# Patient Record
Sex: Female | Born: 1940 | Hispanic: No | Smoking: Former smoker
Health system: Southern US, Community
[De-identification: ages and names within clinical notes are randomized; demographics above are authoritative.]

## PROBLEM LIST (undated history)

## (undated) DIAGNOSIS — E079 Disorder of thyroid, unspecified: Secondary | ICD-10-CM

## (undated) DIAGNOSIS — I10 Essential (primary) hypertension: Secondary | ICD-10-CM

---

## 2000-03-22 ENCOUNTER — Other Ambulatory Visit: Admission: RE | Admit: 2000-03-22 | Discharge: 2000-03-22 | Payer: Self-pay | Admitting: Obstetrics & Gynecology

## 2009-05-09 ENCOUNTER — Encounter (HOSPITAL_COMMUNITY): Admission: RE | Admit: 2009-05-09 | Discharge: 2009-06-08 | Payer: Self-pay | Admitting: Pulmonary Disease

## 2009-06-10 ENCOUNTER — Encounter (HOSPITAL_COMMUNITY): Admission: RE | Admit: 2009-06-10 | Discharge: 2009-07-10 | Payer: Self-pay | Admitting: Pulmonary Disease

## 2010-11-06 ENCOUNTER — Emergency Department (HOSPITAL_COMMUNITY)
Admission: EM | Admit: 2010-11-06 | Discharge: 2010-11-06 | Disposition: A | Discharge status: Home / Self Care | Payer: Medicare Other | Attending: Emergency Medicine | Admitting: Emergency Medicine

## 2010-11-06 DIAGNOSIS — Z853 Personal history of malignant neoplasm of breast: Secondary | ICD-10-CM | POA: Insufficient documentation

## 2010-11-06 DIAGNOSIS — S20219A Contusion of unspecified front wall of thorax, initial encounter: Secondary | ICD-10-CM | POA: Insufficient documentation

## 2010-11-06 DIAGNOSIS — Y92410 Unspecified street and highway as the place of occurrence of the external cause: Secondary | ICD-10-CM | POA: Insufficient documentation

## 2010-11-06 DIAGNOSIS — S8010XA Contusion of unspecified lower leg, initial encounter: Secondary | ICD-10-CM | POA: Insufficient documentation

## 2010-11-06 DIAGNOSIS — I10 Essential (primary) hypertension: Secondary | ICD-10-CM | POA: Insufficient documentation

## 2010-11-06 DIAGNOSIS — S139XXA Sprain of joints and ligaments of unspecified parts of neck, initial encounter: Secondary | ICD-10-CM | POA: Insufficient documentation

## 2011-03-12 ENCOUNTER — Ambulatory Visit (HOSPITAL_COMMUNITY)
Admission: RE | Admit: 2011-03-12 | Discharge: 2011-03-12 | Disposition: A | Discharge status: Home / Self Care | Payer: Medicare Other | Source: Ambulatory Visit | Attending: Pulmonary Disease | Admitting: Pulmonary Disease

## 2011-03-12 DIAGNOSIS — IMO0001 Reserved for inherently not codable concepts without codable children: Secondary | ICD-10-CM | POA: Insufficient documentation

## 2011-03-12 DIAGNOSIS — I972 Postmastectomy lymphedema syndrome: Secondary | ICD-10-CM | POA: Insufficient documentation

## 2011-03-12 DIAGNOSIS — I890 Lymphedema, not elsewhere classified: Secondary | ICD-10-CM | POA: Insufficient documentation

## 2011-03-12 NOTE — Patient Instructions (Addendum)
Self massage

## 2011-03-12 NOTE — Progress Notes (Signed)
Physical Therapy Evaluation  Patient Details  Name: SARIYAH CORCINO MRN: [id number] Date of Birth: [birth year]  Today's Date: 03/12/2011 Time: 4098-1191 Time Calculation (min): 47 min Visit#: 1  of 12   Re-eval: 04/11/11    Past Medical History: No past medical history on file. Past Surgical History: No past surgical history on file.  Subjective Symptoms/Limitations Symptoms: The patient states that she had two surgeries the first was a lumpectomy with 5 nodes removed.  The second surgery,two weeks later,was a radical masectomy  with 6 nodes removed.  The patitent states that she opted not to have chemo or radiation therapy.  She states that she noticed a few months after surgery she noted that she had more swelling in her right arm.. She has recieved an arm sleeve and  glove.  The patient states that she wraps herselft nightly  and self massages nightly.  The patient states that  she has several sleeves and gloves and completes daily bandaging.  The patient states that she was so swollen that her arm was hurting and warm about a month ago.  She states that she has been trying to manage the lymphedema at home and it is significantly better than a month ago but she is unable to get the swelling down to it's previous level.  She has made good gains on her own but she is still experiencing increased swelling therefore she has beeen referred to physical therepy.  How long can you sit comfortably?: no problem How long can you stand comfortably?: no problem How long can you walk comfortably?: no problem Pain Assessment Currently in Pain?: No/denies  Precautions/Restrictions  S/P radical masectomy R  Prior Functioning    I in wrapping and self-massage.    Assessment:    Pt total volume of her R UE is 2183.002 Left is 1550.51.        Physical Therapy Assessment and Plan PT Assessment and Plan Clinical Impression Statement: Pt with lymphedema which is affecting quality of life.  Pt will  benefit from lymph massage therapy. Rehab Potential: Good Clinical Impairments Affecting Rehab Potential: Increased pain limiting activity PT Frequency: Min 3X/week PT Duration: 4 weeks PT Plan: see three times a week for lymph massage, review of bandaging techiniques     Goals PT Short Term Goals Time to Complete Short Term Goals: 2 weeks PT Short Term Goal 1: I in skin care and bandage care PT Short Term Goal 2: I in HEP to promote lympatic circulation PT Short Term Goal 3: reduce volume by 50% PT Long Term Goals PT Long Term Goal 1: 4 wk reduce volume by 80% PT Long Term Goal 2: verbalize risk of reinjury - 4 wk  Problem List Patient Active Problem List  Diagnoses  . Lymphedema of arm    PT - End of Session Activity Tolerance: Patient tolerated treatment well General Behavior During Session: [facility] for tasks performed   RUSSELL,CINDY 03/12/2011, 5:28 PM  Physician Documentation Your signature is required to indicate approval of the treatment plan as stated above.  Please sign and either send electronically or make a copy of this report for your files and return this physician signed original.   Please mark one 1.__approve of plan  2. ___approve of plan with the following conditions.   ______________________________  _____________________ Physician Signature                                                                                                             Date

## 2011-03-16 ENCOUNTER — Ambulatory Visit (HOSPITAL_COMMUNITY)
Admission: RE | Admit: 2011-03-16 | Discharge: 2011-03-16 | Disposition: A | Discharge status: Home / Self Care | Payer: Medicare Other | Source: Ambulatory Visit | Attending: Pulmonary Disease | Admitting: Pulmonary Disease

## 2011-03-16 DIAGNOSIS — I890 Lymphedema, not elsewhere classified: Secondary | ICD-10-CM

## 2011-03-16 NOTE — Progress Notes (Signed)
Physical Therapy Treatment Patient Details  Name: Anita Weiss MRN: [id number] Date of Birth: [birth year]  Today's Date: 03/16/2011 Time: 1120-1204 Time Calculation (min): 44 min Visit#: 2  of 12   Re-eval: 04/10/11 Charges:  Manual 40'    Subjective: Symptoms/Limitations Symptoms: Pt. reports she's been doing her bandaging as needed and wearing her compression garments.  No pain today. Pain Assessment Currently in Pain?: No/denies   Exercise/Treatments Manual Therapy:  Manual Lymph Drainage to Right Upper Extremity.  Physical Therapy Assessment and Plan PT Assessment and Plan Clinical Impression Statement: Good results from lymph massage; to inquire insurance coverage for a compression pump for patient. PT Plan: Continue per POC.     Problem List Patient Active Problem List  Diagnoses  . Lymphedema of arm    PT - End of Session Activity Tolerance: Patient tolerated treatment well General Behavior During Session: [facility] for tasks performed Cognition: [facility] for tasks performed  Lurena Nida 03/16/2011, 12:52 PM

## 2011-03-19 ENCOUNTER — Ambulatory Visit (HOSPITAL_COMMUNITY)
Admission: RE | Admit: 2011-03-19 | Discharge: 2011-03-19 | Disposition: A | Discharge status: Home / Self Care | Payer: Medicare Other | Source: Ambulatory Visit | Attending: Pulmonary Disease | Admitting: Pulmonary Disease

## 2011-03-19 NOTE — Progress Notes (Signed)
Physical Therapy Treatment Patient Details  Name: Anita Weiss MRN: [id number] Date of Birth: Mar 29, 1941  Today's Date: 03/19/2011 Time: 2130-8657 Time Calculation (min): 41 min Visit#: 3  of 12   Re-eval: 04/10/11 Charges:  Manual 38'    Subjective: Symptoms/Limitations Symptoms: Pt. states she could tell a difference after last session.  States her compression garments are getting old and worn out and will get new ones.  Pt. reports the compression pump representative contacted her to get things in order. Pain Assessment Currently in Pain?: No/denies  Exercise/Treatments  Manual Therapy Manual Therapy:  (Manual Lymph Drainage)  Physical Therapy Assessment and Plan PT Assessment and Plan Clinical Impression Statement: Good results from lymph massage. PT Plan: Continue per POC     Problem List Patient Active Problem List  Diagnoses  . Lymphedema of arm    PT - End of Session Activity Tolerance: Patient tolerated treatment well General Behavior During Session: [facility] for tasks performed Cognition: [facility] for tasks performed  Anita Weiss B 03/19/2011, 5:56 PM

## 2011-03-23 ENCOUNTER — Ambulatory Visit (HOSPITAL_COMMUNITY)
Admission: RE | Admit: 2011-03-23 | Discharge: 2011-03-23 | Disposition: A | Discharge status: Home / Self Care | Payer: Medicare Other | Source: Ambulatory Visit | Attending: Pulmonary Disease | Admitting: Pulmonary Disease

## 2011-03-23 DIAGNOSIS — IMO0001 Reserved for inherently not codable concepts without codable children: Secondary | ICD-10-CM | POA: Insufficient documentation

## 2011-03-23 DIAGNOSIS — I972 Postmastectomy lymphedema syndrome: Secondary | ICD-10-CM | POA: Insufficient documentation

## 2011-03-23 NOTE — Progress Notes (Signed)
Physical Therapy Treatment Patient Details  Name: Anita Weiss MRN: [id number] Date of Birth: [birth year]  Today's Date: 03/23/2011 Time: 3474-2595 Time Calculation (min): 34 min Visit#: 4  of 12   Re-eval: 04/10/11 Charges:  Manual 30 minutes    Subjective: Symptoms/Limitations Symptoms: Pt. reports continued improvement with decreased swelling and heaviness. Pain Assessment Currently in Pain?: No/denies  Exercise/Treatments  Manual Therapy Manual Therapy:  (Manual Lymph Drainage)  Physical Therapy Assessment and Plan PT Assessment and Plan Clinical Impression Statement: Continued compliance with home maintenance and self massage.  Noted reduction in lymph fluid. PT Treatment/Interventions:  (Manual Technique) PT Plan: Continue per POC.    Problem List Patient Active Problem List  Diagnoses  . Lymphedema of arm    PT - End of Session Activity Tolerance: Patient tolerated treatment well General Behavior During Session: [facility] for tasks performed Cognition: [facility] for tasks performed  Emeline Gins B 03/23/2011, 5:32 PM

## 2011-03-26 ENCOUNTER — Ambulatory Visit (HOSPITAL_COMMUNITY)
Admission: RE | Admit: 2011-03-26 | Discharge: 2011-03-26 | Disposition: A | Discharge status: Home / Self Care | Payer: Medicare Other | Source: Ambulatory Visit | Attending: Pulmonary Disease | Admitting: Pulmonary Disease

## 2011-03-26 DIAGNOSIS — I890 Lymphedema, not elsewhere classified: Secondary | ICD-10-CM

## 2011-03-26 NOTE — Progress Notes (Signed)
Physical Therapy Treatment Patient Details  Name: Anita Weiss MRN: [id number] Date of Birth: April 29, 1941  Today's Date: 03/26/2011 Time: 6213-0865 Time Calculation (min): 37 min Visit#: 5  of 12   Re-eval: 04/10/11 Charges:  Manual 35'    Subjective: Symptoms/Limitations Symptoms: Pt. states it is getting better.  Reports she will be out of town all next week but will be returning the next week. Pain Assessment Currently in Pain?: No/denies  Exercise/Treatments  Manual Therapy Manual Therapy: Other (comment) Other Manual Therapy: Manual Lymph Drainage  Physical Therapy Assessment and Plan PT Assessment and Plan Clinical Impression Statement: Continued positive results with manual lymph massage. PT Treatment/Interventions:  (Manual Lymph Drainage) PT Plan: Re-assess upon return (2 more visits).    Problem List Patient Active Problem List  Diagnoses  . Lymphedema of arm    PT - End of Session Activity Tolerance: Patient tolerated treatment well General Behavior During Session: [facility] for tasks performed Cognition: [facility] for tasks performed  Emeline Gins B 03/26/2011, 2:27 PM

## 2011-03-30 ENCOUNTER — Ambulatory Visit (HOSPITAL_COMMUNITY): Payer: Medicare Other | Admitting: Physical Therapy

## 2011-04-06 ENCOUNTER — Ambulatory Visit (HOSPITAL_COMMUNITY)
Admission: RE | Admit: 2011-04-06 | Discharge: 2011-04-06 | Disposition: A | Discharge status: Home / Self Care | Payer: Medicare Other | Source: Ambulatory Visit | Attending: Pulmonary Disease | Admitting: Pulmonary Disease

## 2011-04-06 NOTE — Progress Notes (Signed)
Physical Therapy Treatment Patient Details  Name: ORRIE LASCANO MRN: [id number] Date of Birth: [birth year]  Today's Date: 04/06/2011 Time: 4098-1191 Time Calculation (min): 45 min Visit#: 6  of 12   Re-eval: 04/09/11 Charges:  Manual 40 minutes   Subjective: Symptoms/Limitations Symptoms: Pt. reports she had a good vacation.  States she found her night compression sleeve and has been wearing it and doing self-massages when she can. Pain Assessment Currently in Pain?: No/denies   Exercise/Treatments Manual Therapy: Manual Lymph Drainage for Right Upper Extremity  Physical Therapy Assessment and Plan PT Assessment and Plan Clinical Impression Statement: Continued improvement despite week of missed therapy.  Pt. contacted by compression pump rep. [facility]) and will follow up with expected issue date. PT Plan: Remeasure next visit.    Problem List Patient Active Problem List  Diagnoses  . Lymphedema of arm    PT - End of Session Activity Tolerance: Patient tolerated treatment well General Behavior During Session: [facility] for tasks performed Cognition: [facility] for tasks performed  Emeline Gins B 04/06/2011, 2:03 PM

## 2011-04-09 ENCOUNTER — Ambulatory Visit (HOSPITAL_COMMUNITY): Admission: RE | Admit: 2011-04-09 | Payer: Medicare Other | Source: Ambulatory Visit | Admitting: Physical Therapy

## 2011-04-15 ENCOUNTER — Ambulatory Visit (HOSPITAL_COMMUNITY)
Admission: RE | Admit: 2011-04-15 | Discharge: 2011-04-15 | Disposition: A | Discharge status: Home / Self Care | Payer: Medicare Other | Source: Ambulatory Visit | Attending: Pulmonary Disease | Admitting: Pulmonary Disease

## 2011-04-15 NOTE — Progress Notes (Signed)
Physical Therapy Treatment Patient Details  Name: Anita Weiss MRN: [id number] Date of Birth: Oct 17, 1940  Today's Date: 04/15/2011 Time: 1515-1600 Time Calculation (min): 45 min Visit#: 7  (Pt. missed 1 week due to vacation) of 12   Re-eval: 04/15/11 Charges:  Manual 40 minutes    Subjective: Symptoms/Limitations Symptoms: Pt. states she still has not heard from the compression pump; Informed I would contact the representative.  Pt. reports her arm is still not as small as she would like. Pain Assessment Currently in Pain?: No/denies  Objective:   Exercise/Treatments Manual Therapy Other Manual Therapy: manual lymph drainage for Right upper extremity.  Physical Therapy Assessment and Plan PT Assessment and Plan Clinical Impression Statement: Right upper extremity measured today with overall reduction of fluid from 2,183cc to 2,099cc equating to 86% fluid reduction.  Pt. has made good progress, however would like to continue 1X week until receives pump and purchases new compression garments. PT Frequency: Min 1X/week PT Duration: 4 weeks PT Plan: Suggest to continue 1X week for 4 weeks to progress fluid reduction.  To contact [facility] regarding pump.    Goals PT Short Term Goals Time to Complete Short Term Goals: 2 weeks PT Short Term Goal 1: I in skin care and bandage care PT Short Term Goal 1 - Progress: Met PT Short Term Goal 2: I in HEP to promote lympatic circulation PT Short Term Goal 2 - Progress: Met PT Short Term Goal 3: reduce volume by 50% PT Short Term Goal 3 - Progress: Met PT Long Term Goals PT Long Term Goal 1: 4 wk reduce volume by 80% PT Long Term Goal 1 - Progress: Met PT Long Term Goal 2: verbalize risk of reinjury - 4 wk PT Long Term Goal 2 - Progress: Met  Problem List Patient Active Problem List  Diagnoses  . Lymphedema of arm    PT - End of Session Activity Tolerance: Patient tolerated treatment well General Behavior During  Session: [facility] for tasks performed Cognition: [facility] for tasks performed  Lurena Nida 04/15/2011, 5:49 PM

## 2011-04-23 ENCOUNTER — Ambulatory Visit (HOSPITAL_COMMUNITY): Payer: Medicare Other | Admitting: Physical Therapy

## 2011-04-30 ENCOUNTER — Ambulatory Visit (HOSPITAL_COMMUNITY): Payer: Medicare Other | Admitting: Physical Therapy

## 2011-05-13 ENCOUNTER — Ambulatory Visit (HOSPITAL_COMMUNITY): Payer: Medicare Other | Admitting: Physical Therapy

## 2011-05-20 ENCOUNTER — Ambulatory Visit (HOSPITAL_COMMUNITY): Payer: Medicare Other | Admitting: Physical Therapy

## 2011-11-24 ENCOUNTER — Ambulatory Visit (HOSPITAL_COMMUNITY)
Admission: RE | Admit: 2011-11-24 | Discharge: 2011-11-24 | Disposition: A | Discharge status: Home / Self Care | Payer: Medicare Other | Source: Ambulatory Visit | Attending: Pulmonary Disease | Admitting: Pulmonary Disease

## 2011-11-24 DIAGNOSIS — I972 Postmastectomy lymphedema syndrome: Secondary | ICD-10-CM | POA: Insufficient documentation

## 2011-11-24 DIAGNOSIS — IMO0001 Reserved for inherently not codable concepts without codable children: Secondary | ICD-10-CM | POA: Insufficient documentation

## 2011-11-24 DIAGNOSIS — I890 Lymphedema, not elsewhere classified: Secondary | ICD-10-CM

## 2011-11-24 NOTE — Evaluation (Signed)
Physical Therapy Evaluation  Patient Details  Name: Anita Weiss MRN: [id number] Date of Birth: 09-29-1940  Today's Date: 11/24/2011 Time: 0865-7846 PT Time Calculation (min): 47 min  Visit#:   of 8   Re-eval: 12/24/11 Assessment Diagnosis: lymphedema R UE Surgical Date: 01/09/11 Next MD Visit: next month Prior Therapy: lymphedma massage  last Rx 19yrs   Authorization: medicare  Authorization Time Period:    Authorization Visit#:   of     Past Medical History: No past medical history on file. Past Surgical History: No past surgical history on file.  Subjective Symptoms/Limitations Symptoms: Anita Weiss has been seen in this clinic for lymhedma before.  She was doing well and she noticed a rash all over her and had so much swelling in her right arm that she was blooding her garments three months ago.  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  The pt states that she is actually doing significantly better but she is still not where she wants to be so she was referred for lymph massage.   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  Functional Tests Functional Tests: volume of L  5700.5 R 7593.38  Assessment RUE Assessment RUE Assessment: Within Functional Limits RUE AROM (degrees) Overall AROM Right Upper Extremity: Within functional limits for tasks performed  Exercise/Treatments Manual lymph drainage massage.    Physical Therapy Assessment and Plan PT Assessment and Plan Clinical Impression Statement: Pt with increased volume of Right UE that is not responding to home massage and bandaging or pumping.  PT will benefit from skilled PT to reduce volume of R UE to reduce risk of infection. Rehab Potential: Good PT Frequency: Min 3X/week PT Duration: 4 weeks PT Treatment/Interventions: Other (comment) PT  Plan: manual lymph drainage    Goals Home Exercise Program Pt will Perform Home Exercise Program: Independently PT Short Term Goals Time to Complete Short Term Goals: 2 weeks PT Short Term Goal 1: I in skin care and bandage care PT Short Term Goal 2: I HEP to promote lymphatic circulation PT Short Term Goal 3: reduce volume by 50% PT Long Term Goals Time to Complete Long Term Goals: 4 weeks PT Long Term Goal 1: reduce volume by 80% PT Long Term Goal 2: verbalize risk of reinjury  Problem List Patient Active Problem List  Diagnoses  . Lymphedema of arm    PT - End of Session Activity Tolerance: Patient tolerated treatment well General Behavior During Session: Anita Weiss for tasks performed Cognition: Anita Weiss for tasks performed PT Plan of Care PT Home Exercise Plan: If no notable difference in volume after two treatments begin compression bandaging. Consulted and Agree with Plan of Care: Patient  GP  Functional Reporting Modifier  Current Status  [id number] - Other PT/OT Primary CJ - At least 20% but less than 40% impaired, limited or restricted  Goal Status  [phone] - Other PT/OT Primary CI - At least 1% but less than 20% impaired, limited or restricted  I chose CJ due to volume being 33% above normal; CI due to the fact pt will not ever get down 100% Anita Weiss,Anita Weiss 11/24/2011, 4:45 PM  Physician Documentation Your signature is required to indicate approval of the treatment plan as stated above.  Please sign and either send electronically or make a copy of this report for your files and return this  physician signed original.   Please mark one 1.__approve of plan  2. ___approve of plan with the following conditions.   ______________________________                                                          _____________________ Physician Signature                                                                                                             Date

## 2011-11-26 ENCOUNTER — Ambulatory Visit (HOSPITAL_COMMUNITY)
Admission: RE | Admit: 2011-11-26 | Discharge: 2011-11-26 | Disposition: A | Discharge status: Home / Self Care | Payer: Medicare Other | Source: Ambulatory Visit | Attending: *Deleted | Admitting: *Deleted

## 2011-11-26 NOTE — Progress Notes (Signed)
Physical Therapy Lymphedema Treatment Patient Details  Name: Anita Weiss MRN: [id number] Date of Birth: 04-19-1941  Today's Date: 11/26/2011 Time: 1520-1600 PT Time Calculation (min): 40 min Visit#: 2  of 12   Re-eval: 12/24/11 Authorization: medicare  Authorization Visit#: 2  of 10   Charges:  Manual 40'  Subjective: Symptoms/Limitations Symptoms: Pt. states she has 3 garments she rotates but needs to order a newer one.  Pt. given information of website to order from.  Pt. reports no pain and the itching has significantly improved. Pain Assessment Currently in Pain?: No/denies  Objective: Manual Therapy Other Manual Therapy: Manual Lymph Drainage of R UE into R inguinal nodes and L axillary nodes  Physical Therapy Assessment and Plan PT Assessment and Plan Clinical Impression Statement: MLD completed to R UE without further complication to irritated skin.  Pt. reported "feeling the fluid move" as MLD performed. PT Plan: Re-measure next visit.    Problem List Patient Active Problem List  Diagnoses  . Lymphedema of arm    PT - End of Session Activity Tolerance: Patient tolerated treatment well General Behavior During Session: [facility] for tasks performed Cognition: [facility] for tasks performed   Lurena Nida, PTA/CLT 11/26/2011, 4:56 PM

## 2011-12-09 ENCOUNTER — Ambulatory Visit (HOSPITAL_COMMUNITY): Payer: Medicare Other | Admitting: Physical Therapy

## 2011-12-10 ENCOUNTER — Ambulatory Visit (HOSPITAL_COMMUNITY): Payer: Medicare Other | Admitting: Physical Therapy

## 2011-12-14 ENCOUNTER — Ambulatory Visit (HOSPITAL_COMMUNITY)
Admission: RE | Admit: 2011-12-14 | Discharge: 2011-12-14 | Disposition: A | Discharge status: Home / Self Care | Payer: Medicare Other | Source: Ambulatory Visit | Attending: *Deleted | Admitting: *Deleted

## 2011-12-14 NOTE — Progress Notes (Signed)
Physical Therapy Lymphedema Treatment Patient Details  Name: Anita Weiss MRN: [id number] Date of Birth: 06-11-1941  Today's Date: 12/14/2011 Time: 1525-1605 PT Time Calculation (min): 40 min Visit#: 3  of 12   Re-eval: 12/24/11 Charges:  Manual 38'    Authorization: medicare  Authorization Time Period:    Authorization Visit#: 3  of 10    Subjective: Symptoms/Limitations Symptoms: Pt. states she has been helping her daughter move and has been using her UE's alot.  Pt. admits to not wearing her gauntlet as she should.  Pt. with noted swelling in hand today. Pain Assessment Currently in Pain?: No/denies  Objective: Manual Therapy Other Manual Therapy: Manual Lymph Drainage of R UE into R inguinal nodes and L axillary nodes    Measurements taken today of R UE as compared to L UE: Date 12/14/2011    right left  MCP 19.70 18.00  wrist 16.40 15.50  4cm 18.20 16.30  8cm 22.20 18.60  12cm 25.50 21.50  16cm 28.80 23.30  20cm 38.80 24.70  24cm 27.00 24.50  28cm 28.50 34.60  32cm 28.50 25.50  36cm 28.60 27.50  40cm 33.00 29.00  44cm    48cm    52cm    56cm    60cm    64cm        Sum of squares 8726.72 6836.04  Total Volume 2777.8022 2175.98    Physical Therapy Assessment and Plan PT Assessment and Plan Clinical Impression Statement: Total volume of lymph in R UE exceeds L by 601.82cc.  Unsure if volume has increased or decreased due to measurements recorded at evaluation were the sum of the measurements, not volume.  Pt. has missed the last 2 weeks due to vacation.   Pt. did have noted improvement after last  round with therapy and will be able to keep all future appts.  PT Plan: Continue current POC.  Measure again next week for progress.     Problem List Patient Active Problem List  Diagnosis  . Lymphedema of arm      Lurena Nida, PTA/CLT 12/14/2011, 4:39 PM

## 2011-12-17 ENCOUNTER — Ambulatory Visit (HOSPITAL_COMMUNITY)
Admission: RE | Admit: 2011-12-17 | Discharge: 2011-12-17 | Disposition: A | Discharge status: Home / Self Care | Payer: Medicare Other | Source: Ambulatory Visit | Attending: *Deleted | Admitting: *Deleted

## 2011-12-17 NOTE — Progress Notes (Signed)
Physical Therapy Treatment Patient Details  Name: Anita Weiss MRN: [id number] Date of Birth: [birth year]  Today's Date: 12/17/2011 Time: 1520-1600 PT Time Calculation (min): 40 min Visit#: 4  of 12   Re-eval: 12/24/11 Charges:  Manual 38'    Authorization: medicare  Authorization Visit#: 4  of 10    Subjective: Symptoms/Limitations Symptoms: Pt. states she urinated alot after last visit and has been trying to massage her sides/back better to route her fluid when using her pump. Pain Assessment Currently in Pain?: No/denies   Objective: Manual Therapy Other Manual Therapy: Manual Lymph Drainage of R UE into R inguinal nodes and L axillary nodes  Physical Therapy Assessment and Plan PT Assessment and Plan Clinical Impression Statement: Decreased fullness noted in R UE and lateral thorax today.  Pt. voiding more after last visit thus reducing volume.  PT Plan: Re-evaluate next visit; Pt. will be at 4 weeks.    Problem List Patient Active Problem List  Diagnosis  . Lymphedema of arm    Lurena Nida, PTA/CLT 12/17/2011, 6:10 PM

## 2011-12-21 ENCOUNTER — Ambulatory Visit (HOSPITAL_COMMUNITY)
Admission: RE | Admit: 2011-12-21 | Discharge: 2011-12-21 | Disposition: A | Discharge status: Home / Self Care | Payer: Medicare Other | Source: Ambulatory Visit | Attending: Pulmonary Disease | Admitting: Pulmonary Disease

## 2011-12-21 DIAGNOSIS — I890 Lymphedema, not elsewhere classified: Secondary | ICD-10-CM

## 2011-12-21 DIAGNOSIS — I972 Postmastectomy lymphedema syndrome: Secondary | ICD-10-CM | POA: Insufficient documentation

## 2011-12-21 DIAGNOSIS — IMO0001 Reserved for inherently not codable concepts without codable children: Secondary | ICD-10-CM | POA: Insufficient documentation

## 2011-12-21 NOTE — Progress Notes (Signed)
Physical Therapy Lymphedema Treatment Patient Details  Name: Anita Weiss MRN: [id number] Date of Birth: [birth year]  Today's Date: 12/21/2011 Time: 1522-1600 PT Time Calculation (min): 38 min Visit#: 5  of 24   Re-eval: 01/18/12 Charges:  Manual 38'   Authorization: medicare  Authorization Visit#: 5  of 10    Subjective: Symptoms/Limitations Symptoms: Pt. states she has continued to void well and has lost 2 pounds according to the scales this morning.  Pt. reports she doesn't feel as bloated in her stomach either. Pain Assessment Currently in Pain?: No/denies   Objective: Manual Therapy Other Manual Therapy: Manual Lymph Drainage of R UE into R inguinal nodes and L axillary nodes.  Physical Therapy Assessment and Plan PT Assessment and Plan Clinical Impression Statement: Pt with overall reduction of 300.86cc as compared to measurement taken on 6/24 (nearly 2 weeks ago).   Pt. Has met all her STG's and 1/3 LTG's and is overall responding well to MLD.   Pt. has had total of 5 visits of 12  due to vacation.  Pt would benefit from 4 more weeks to further reduce lymph fluid.   PT Frequency: Min 3X/week PT Duration: 4 weeks PT Plan: Request to continue X 4 more weeks.    Goals Home Exercise Program Pt will Perform Home Exercise Program: Independently PT Goal: Perform Home Exercise Program - Progress: Met PT Short Term Goals Time to Complete Short Term Goals: 2 weeks PT Short Term Goal 1: I in skin care and bandage care PT Short Term Goal 1 - Progress: Met PT Short Term Goal 2: I HEP to promote lymphatic circulation PT Short Term Goal 2 - Progress: Met PT Short Term Goal 3: reduce volume by 50% PT Short Term Goal 3 - Progress: Met PT Long Term Goals Time to Complete Long Term Goals: 4 weeks PT Long Term Goal 1: reduce volume by 80% PT Long Term Goal 1 - Progress: Not met PT Long Term Goal 2: verbalize risk of reinjury PT Long Term Goal 2 - Progress: Partly met  Problem  List Patient Active Problem List  Diagnosis  . Lymphedema of arm    PT - End of Session Activity Tolerance: Patient tolerated treatment well General Behavior During Session: [facility] for tasks performed Cognition: [facility] for tasks performed   Lurena Nida, PTA/CLT 12/21/2011, 6:11 PM

## 2011-12-28 ENCOUNTER — Ambulatory Visit (HOSPITAL_COMMUNITY)
Admission: RE | Admit: 2011-12-28 | Discharge: 2011-12-28 | Disposition: A | Discharge status: Home / Self Care | Payer: Medicare Other | Source: Ambulatory Visit | Attending: Pulmonary Disease | Admitting: Pulmonary Disease

## 2011-12-28 NOTE — Progress Notes (Signed)
Physical Therapy Lymphedema Treatment Patient Details  Name: Anita Weiss MRN: [id number] Date of Birth: 1941/06/08  Today's Date: 12/28/2011 Time: 4098-1191 PT Time Calculation (min): 38 min Visit#: 6  of 16   Re-eval: 01/18/12 Charges:  Manual 38'    Authorization: medicare  Authorization Visit#: 6  of 10    Subjective: Symptoms/Limitations Symptoms: Pt. states she got her appt. times mixed up today.  States her arm is feeling better with less tightness. Pain Assessment Currently in Pain?: No/denies   Exercise/Treatments Objective: Manual Therapy Other Manual Therapy: Manual Lymph Drainage of R UE into R inguinal nodes and L axillary nodes  Physical Therapy Assessment and Plan PT Assessment and Plan Clinical Impression Statement: Pt unable to complete 3 visits a week, but will come 2X week for the next 4 weeks.  Pt is continuing to respond well to MLD.    Problem List Patient Active Problem List  Diagnosis  . Lymphedema of arm    PT - End of Session Activity Tolerance: Patient tolerated treatment well General Behavior During Session: [facility] for tasks performed Cognition: [facility] for tasks performed PT Plan of Care PT Home Exercise Plan: Continue per POC X 4 more weeks.   Lurena Nida, PTA/CLT 12/28/2011, 4:46 PM

## 2011-12-30 ENCOUNTER — Ambulatory Visit (HOSPITAL_COMMUNITY): Payer: Medicare Other | Admitting: Physical Therapy

## 2012-01-05 ENCOUNTER — Ambulatory Visit (HOSPITAL_COMMUNITY)
Admission: RE | Admit: 2012-01-05 | Discharge: 2012-01-05 | Disposition: A | Discharge status: Home / Self Care | Payer: Medicare Other | Source: Ambulatory Visit | Attending: Pulmonary Disease | Admitting: Pulmonary Disease

## 2012-01-05 NOTE — Progress Notes (Signed)
Physical Therapy Treatment Patient Details  Name: Anita Weiss MRN: [id number] Date of Birth: [birth year]  Today's Date: 01/05/2012 Time: 1520-1555 PT Time Calculation (min): 35 min Visit#: 7  of 16   Re-eval: 01/18/12 Authorization: medicare  Authorization Visit#: 7  of 10  Charges:  Manual 30'   Subjective: Symptoms/Limitations Symptoms: Pt. with several red/irritated areas on her R forearm.  Area was warm to touch; pt. states she has been scratching it and has been itching.  Discussed possible cellulitis with patient and may want to see MD to treat.   Pain Assessment Currently in Pain?: No/denies  Objective: Manual Therapy Other Manual Therapy: Manual Lymph Drainage of R UE into R inguinal nodes and L axillary nodes   Physical Therapy Assessment and Plan PT Assessment and Plan Clinical Impression Statement: Concentrated on R lateral thoracic area, back and upper R arm.  Did not perform MLD on lower forearm due to red/warm area.  PT. advised to seek medical attention and symptoms to watch for cellulitis. PT Plan: Continue MLD.    Problem List Patient Active Problem List  Diagnosis  . Lymphedema of arm    PT - End of Session Activity Tolerance: Patient tolerated treatment well General Behavior During Session: [facility] for tasks performed Cognition: [facility] for tasks performed   Lurena Nida, PTA/CLT 01/05/2012, 4:37 PM

## 2012-01-11 ENCOUNTER — Ambulatory Visit (HOSPITAL_COMMUNITY)
Admission: RE | Admit: 2012-01-11 | Discharge: 2012-01-11 | Disposition: A | Discharge status: Home / Self Care | Payer: Medicare Other | Source: Ambulatory Visit | Attending: Pulmonary Disease | Admitting: Pulmonary Disease

## 2012-01-11 NOTE — Progress Notes (Signed)
Physical Therapy Lymphedema Treatment Patient Details  Name: Anita Weiss MRN: [id number] Date of Birth: 01/31/1941  Today's Date: 01/11/2012 Time: 4098-1191 PT Time Calculation (min): 43 min Visit#: 8  of 16   Re-eval: 01/18/12 Charges:  Manual 40'   Authorization: medicare  Authorization Time Period:    Authorization Visit#: 8  of 10    Subjective: Symptoms/Limitations Symptoms: Pt. statest the areas are still there but not itching or bothering her at all.  Pt. reports no changes/worsening after last session.  Pt. states she did not go to the MD, advised it was still a good idea. Pain Assessment Currently in Pain?: No/denies   Objective: Manual Therapy Other Manual Therapy: Manual Lymph Drainage of R UE into R inguinal nodes and L axillary nodes   Physical Therapy Assessment and Plan PT Assessment and Plan Clinical Impression Statement: Redness/warmth remains in distal forearm, however to worse/better.  Pt. still advised to seek medical attention.  No pain voiced with MLD today.  PT Plan: Remeasure next visit; re-eval X 2 more visits and issue G-Codes.     Problem List Patient Active Problem List  Diagnosis  . Lymphedema of arm    PT - End of Session Activity Tolerance: Patient tolerated treatment well General Behavior During Session: [facility] for tasks performed Cognition: [facility] for tasks performed   Lurena Nida, PTA/CLT 01/11/2012, 2:36 PM

## 2012-01-14 ENCOUNTER — Ambulatory Visit (HOSPITAL_COMMUNITY)
Admission: RE | Admit: 2012-01-14 | Discharge: 2012-01-14 | Disposition: A | Discharge status: Home / Self Care | Payer: Medicare Other | Source: Ambulatory Visit | Attending: Pulmonary Disease | Admitting: Pulmonary Disease

## 2012-01-14 NOTE — Progress Notes (Signed)
Physical Therapy Lymphedema Treatment Patient Details  Name: Anita Weiss MRN: [id number] Date of Birth: 12-May-1941  Today's Date: 01/14/2012 Time: 7829-5621 PT Time Calculation (min): 39 min Visit#: 9  of 16   Re-eval: 01/18/12 Charges:  Manual 38' Authorization: medicare  Authorization Visit#: 9  of 10    Subjective: Symptoms/Limitations Symptoms: Pt. reports overall improvement in redness/swelling; no longer itching anymore.  Currently without pain today. Pain Assessment Currently in Pain?: No/denies  Objective: Manual Therapy Other Manual Therapy: Manual Lymph Drainage of R UE into R inguinal nodes and L axillary nodes   Physical Therapy Assessment and Plan PT Assessment and Plan Clinical Impression Statement: OVerall  improvement in redness swelling R UE.  Pt. compliant with self care and wearing compression garment. PT Plan: G-Codes needed next visit; remeasure and re-eval     Problem List Patient Active Problem List  Diagnosis  . Lymphedema of arm    PT - End of Session Activity Tolerance: Patient tolerated treatment well General Behavior During Session: [facility] for tasks performed Cognition: [facility] for tasks performed   Lurena Nida, PTA/CLT 01/14/2012, 3:20 PM

## 2012-01-18 ENCOUNTER — Ambulatory Visit (HOSPITAL_COMMUNITY)
Admission: RE | Admit: 2012-01-18 | Discharge: 2012-01-18 | Disposition: A | Discharge status: Home / Self Care | Payer: Medicare Other | Source: Ambulatory Visit | Attending: Pulmonary Disease | Admitting: Pulmonary Disease

## 2012-01-18 NOTE — Progress Notes (Signed)
Physical Therapy Lymphedema Discharge/ G-Codes Patient Details  Name: Anita Weiss MRN: [id number] Date of Birth: Mar 19, 1941  Today's Date: 01/18/2012 Time: 1352-1430 PT Time Calculation (min): 38 min Visit#: 10  of 16   Diagnosis:  Lymphedema of Right UE Charges:  Manual 38  Subjective: Symptoms/Limitations Symptoms: Pt. states she can really tell an improvement under her arm and in her trunk.  States she is going to get a compression bra to keep the fluid out of this area.  Pt. has began itching her forearm again; redness/rash and heat noted and is the most concentrated area of fluid. Pain Assessment Currently in Pain?: No/denies  Objective: Manual Therapy Other Manual Therapy: Manual Lymph Drainage of R UE into R inguinal nodes and L axillary nodes   Measurements:  Physical Therapy Assessment and Plan PT Assessment and Plan Clinical Impression Statement: Pt. has met STG of 50% reduction, currently 57% reduction in fluid since initial evaluation.  Fluid loss has began to slow, with loss of 44.55cc X past 28 days and still has 256.41 cc greater than other UE. Pt. is independent in self care and intends to purchase a compression bra.   PT Plan: Discharge to home maintenance/ self-care program.    Goals Home Exercise Program Pt will Perform Home Exercise Program: Independently PT Goal: Perform Home Exercise Program - Progress: Met PT Short Term Goals Time to Complete Short Term Goals: 2 weeks PT Short Term Goal 1: I in skin care and bandage care PT Short Term Goal 1 - Progress: Met PT Short Term Goal 2: I HEP to promote lymphatic circulation PT Short Term Goal 2 - Progress: Met PT Short Term Goal 3: reduce volume by 50% PT Short Term Goal 3 - Progress: Met PT Long Term Goals Time to Complete Long Term Goals: 4 weeks PT Long Term Goal 1: reduce volume by 80% PT Long Term Goal 1 - Progress: Not met PT Long Term Goal 2: verbalize risk of reinjury PT Long Term Goal 2 -  Progress: Met  Problem List Patient Active Problem List  Diagnosis  . Lymphedema of arm    PT - End of Session Activity Tolerance: Patient tolerated treatment well General Behavior During Session: [facility] for tasks performed Cognition: [facility] for tasks performed  G-Codes determined by Donnamae Jude, PT Functional Limitation: Other PT subsequent Other PT Secondary Goal Status ([id number]): At least 1 percent but less than 20 percent impaired, limited or restricted Other PT Secondary Discharge Status [phone]): At least 1 percent but less than 20 percent impaired, limited or restricted  Lurena Nida, PTA/CLT 01/18/2012, 4:57 PM

## 2012-01-21 ENCOUNTER — Ambulatory Visit (HOSPITAL_COMMUNITY): Payer: Medicare Other | Admitting: Physical Therapy

## 2013-11-21 ENCOUNTER — Ambulatory Visit (HOSPITAL_COMMUNITY)
Admission: RE | Admit: 2013-11-21 | Discharge: 2013-11-21 | Disposition: A | Discharge status: Home / Self Care | Payer: Medicare Other | Source: Ambulatory Visit | Attending: Pulmonary Disease | Admitting: Pulmonary Disease

## 2013-11-21 DIAGNOSIS — I890 Lymphedema, not elsewhere classified: Secondary | ICD-10-CM | POA: Diagnosis present

## 2013-11-21 DIAGNOSIS — IMO0001 Reserved for inherently not codable concepts without codable children: Secondary | ICD-10-CM | POA: Insufficient documentation

## 2013-11-21 DIAGNOSIS — I972 Postmastectomy lymphedema syndrome: Secondary | ICD-10-CM | POA: Insufficient documentation

## 2013-11-21 NOTE — Evaluation (Signed)
Physical Therapy Evaluation  Patient Details  Name: Anita Weiss MRN: [id number] Date of Birth: 1940-11-18  Today's Date: 11/21/2013 Time: 1300-1345 PT Time Calculation (min): 45 min Charge:  Evaluation.             Visit#: 1 of 12  Re-eval: 12/21/13    Authorization: medicar    Authorization Time Period:    Authorization Visit#: 1 of 10   Past Medical History: No past medical history on file. Past Surgical History: No past surgical history on file.  Subjective Symptoms/Limitations Symptoms: Anita Weiss has been diagnosed with lymphedama for years.  She has acute exacerbations periodically and needs skilled decongestive therapy.  She has been to this clinic twice before for manual massage techniques to decrease her swelling,.  She states that two of her fingers began itching several weeks ago.  She scratched them until the skin broke and then she wound up with cellulitis.  She was treated by the MD with antibiotics but the swelling has not gone down, therefore she has  requested her MD to send her to therapy.  She has several compression sleeves and has been compliant with wearing them.   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 Pain Assessment Currently in Pain?: No/denies (States her arm just feels heavy and fat.  )   Assessment:     Date 11/21/2013 11/21/2013   Left Right  MCP 19.10 20.30  wrist 16.3 16.8  4cm 16.30 20.01  8cm 18.00 23.50  12 cm 21.00 26.60  16cm 23.80 29.00  20cm 25.00 29.80  24cm 25.20 28.20  28cm 25.30 28.80  32cm 27.00 29.50  36cm 28.50 29.20  40cm 30.90 30.90  44cm         Sum of squares 6623.82 8385.96  Total Volume 2108.428 1007.121     Exercise/Treatments     Manual Therapy Manual Therapy: Other (comment) Other Manual Therapy: Pt received manual decongestive techniques including supraclavicular, deep and superficial abdominal and routing fluid  from [name] to Rt inguinal and Lt axillary nodes.    Physical Therapy Assessment and Plan PT Assessment and Plan Clinical Impression Statement: Anita Weiss has an exacerbation of her lymphedma. Her [name] has 560.9 CC more fluid than her left.   She has tried to control it at home with self massaging and wearing compression sleeves without success.  She will need skilled therapy for manual  decongestion of her Rt arm.  Pt will benefit from skilled therapeutic intervention in order to improve on the following deficits: Increased edema Rehab Potential: Good PT Frequency: Min 3X/week PT Duration: 4 weeks PT Plan: begin with manual techniques only folllowed by pt donning her compression sleeve.  If results are not desirable may begin compression wrapping with foam and short stretch bandages.      Goals PT Short Term Goals Time to Complete Short Term Goals: 2 weeks PT Short Term Goal 1: decrease volume by 40%  PT Short Term Goal 2: Pt to verbalize the importance of skin care with lymphedema PT Long Term Goals Time to Complete Long Term Goals: 4 weeks PT Long Term Goal 1: volume decreased 80% to allow pt greater comfort for a higher quality of life. PT Long Term Goal 2: Pt to be self massaging at night.  Problem List Patient Active Problem List   Diagnosis Date Noted  . Lymphedema of arm 03/12/2011  GP Functional Assessment Tool Used: clinical judgement Functional Limitation: Other PT primary Other PT Primary Current Status (Z3086(G8990): At least 20 percent but less than 40 percent impaired, limited or restricted Other PT Primary Goal Status (V7846(G8991): At least 1 percent but less than 20 percent impaired, limited or restricted  Anita KennedyCynthia J Keigo Weiss 11/21/2013, 2:08 PM  Physician Documentation Your signature is required to indicate approval of the treatment plan as stated above.  Please sign and either send electronically or make a copy of this report for your files and return this physician  signed original.   Please mark one 1.__approve of plan  2. ___approve of plan with the following conditions.   ______________________________                                                          _____________________ Physician Signature                                                                                                             Date

## 2013-11-22 ENCOUNTER — Ambulatory Visit (HOSPITAL_COMMUNITY)
Admission: RE | Admit: 2013-11-22 | Discharge: 2013-11-22 | Disposition: A | Discharge status: Home / Self Care | Payer: Medicare Other | Source: Ambulatory Visit | Attending: Pulmonary Disease | Admitting: Pulmonary Disease

## 2013-11-22 NOTE — Progress Notes (Signed)
Physical Therapy Treatment Patient Details  Name: Anita Weiss MRN: [id number] Date of Birth: 1941/05/26  Today's Date: 11/22/2013 Time: 1150-1226 PT Time Calculation (min): 36 min  Visit#: 2 of 12  Re-eval: 12/21/13 Authorization: medicare  Authorization Visit#: 2 of 10  Charges:  Manual 35  Subjective: Symptoms/Limitations Symptoms: Pt states she's been bandaging her [name] at night and if she exercises.  Pt states her skin looks a little strange when she removes it in the morning and thinks she may not be wrapping with even compression.  Pt reports she's been using her compression pump and daily use of garments.  Objective: Manual Therapy Other Manual Therapy: Pt received manual decongestive therapy for [name].  Techniques including supraclavicular, deep and superficial abdominal and routing fluid from [name] to Rt inguinal and Lt axillary nodes anteriorly and posteriorly.   Physical Therapy Assessment and Plan PT Assessment and Plan Clinical Impression Statement: Advised patient to bring in her bandaging supplies next visit so therapist can problem solve materials and techniques.  Pt reported noted improvement with manual techniques.  States her [name] feels much lighter afterward.  Also discussed ordering reidsleeve for nightly use for patient.  Pt is interested in receiving one.   PT Plan: Begin with manual techniques only folllowed by pt donning her compression sleeve.  If results are not desirable may begin compression wrapping with foam and short stretch bandages.  send order to Md for reidsleeve/compression garment and refer to [facility].       Problem List Patient Active Problem List   Diagnosis Date Noted  . Lymphedema of arm 03/12/2011   GP Functional Limitation: Other PT primary  Lurena Nida, PTA/CLT 11/22/2013, 12:37 PM

## 2013-11-23 ENCOUNTER — Ambulatory Visit (HOSPITAL_COMMUNITY)
Admission: RE | Admit: 2013-11-23 | Discharge: 2013-11-23 | Disposition: A | Discharge status: Home / Self Care | Payer: Medicare Other | Source: Ambulatory Visit | Attending: Pulmonary Disease | Admitting: Pulmonary Disease

## 2013-11-23 NOTE — Progress Notes (Signed)
Physical Therapy Treatment Patient Details  Name: Anita Weiss MRN: [id number] Date of Birth: 02-02-1941  Today's Date: 11/23/2013 Time: 6759-1638 PT Time Calculation (min): 42 min Charge:  Manual 4665-9935 Visit#: 3 of 12  Re-eval: 12/21/13    Authorization: medicare  Authorization Visit#: 3 of 10   Subjective: Symptoms/Limitations Symptoms: Pt states she can tell a difference already as she can see her elbow again.  Pt forgot her bandages states she will bring them next session.    Exercise/Treatments      Manual Therapy Other Manual Therapy: Pt received manual decongestive therapy for [name].  Techniques including supraclavicular, deep and superficial abdominal and routing fluid from [name] to Rt inguinal and Lt axillary nodes anteriorly and posteriorly.   Physical Therapy Assessment and Plan PT Assessment and Plan Clinical Impression Statement: Pt will bring bandages in next treatment.  Pt UE decongesting well PT Plan: Pt to continue with decongestive manual techniques.    Goals  progressing  Problem List Patient Active Problem List   Diagnosis Date Noted  . Lymphedema of arm 03/12/2011       GP    [name] 11/23/2013, 4:55 PM

## 2013-11-28 ENCOUNTER — Ambulatory Visit (HOSPITAL_COMMUNITY)
Admission: RE | Admit: 2013-11-28 | Discharge: 2013-11-28 | Disposition: A | Discharge status: Home / Self Care | Payer: Medicare Other | Source: Ambulatory Visit | Attending: Pulmonary Disease | Admitting: Pulmonary Disease

## 2013-11-28 NOTE — Progress Notes (Signed)
Physical Therapy Treatment Patient Details  Name: Anita Weiss MRN: [id number] Date of Birth: 10/07/1940  Today's Date: 11/28/2013 Time: 1520-1602 PT Time Calculation (min): 42 min Charge:  Manual 1520-1602 Visit#: 4 of 12  Re-eval: 12/21/13   Authorization Visit#: 4 of 10   Subjective: Symptoms/Limitations Symptoms: Pt states she did not wrap herself last night and can tell she is more swollen.  Pt states she scratched her hand as well and can tell that she has increased swelling and stiffness.    Exercise/Treatments Date 11/21/2013  11/28/2013    Left Right right   MCP 19.10 20.30 20   wrist 16.3 16.8 17.70   4cm 16.30 20.01 19.60   8cm 18.00 23.50 23.00   12 cm 21.00 26.60 26.20   16cm 23.80 29.00 28.80   20cm 25.00 29.80 29.80   24cm 25.20 28.20 29.00   28cm 25.30 28.80 29.30   32cm 27.00 29.50 29.60   36cm 28.50 29.20 29.30   40cm 30.90 30.90 31.50   44cm      48cm      52cm      56cm      60cm      64cm            Sum of squares 6623.82 8385.96 8456.76 0.00  Total Volume 2108.428 [id number] 6010.9323 0              Manual Therapy Manual Therapy: Edema management Other Manual Therapy: manual decongestive theapy includng supraclavicular, deep and superficial abdominal, routing fluid from Rt axillary to Lt axillary and Rt inguinal nodes followed by [name]. Pt then placed comprssion wrap on after session.  Physical Therapy Assessment and Plan PT Assessment and Plan Clinical Impression Statement: Pt forgot bandages again.  Pt has increased in fluid but feels it is due to irritation of hand as falling asleep last night prior to wrapping her UE with her bandages  PT Plan: Pt to continue with decongestive manual techniques.    Goals    Problem List Patient Active Problem List   Diagnosis Date Noted  . Lymphedema of arm 03/12/2011       GP    Anita Weiss 11/28/2013, 4:06 PM

## 2013-11-29 ENCOUNTER — Ambulatory Visit (HOSPITAL_COMMUNITY)
Admission: RE | Admit: 2013-11-29 | Discharge: 2013-11-29 | Disposition: A | Discharge status: Home / Self Care | Payer: Medicare Other | Source: Ambulatory Visit | Attending: Pulmonary Disease | Admitting: Pulmonary Disease

## 2013-11-29 NOTE — Progress Notes (Signed)
Physical Therapy Treatment Patient Details  Name: LAKENYA TETRO MRN: [id number] Date of Birth: 1940-12-12  Today's Date: 11/29/2013 Time: 1522-1600 PT Time Calculation (min): 38 min  Visit#: 5 of 12  Re-eval: 12/21/13 Authorization Visit#: 5 of 10  Charges: manual 38  Subjective: Symptoms/Limitations Symptoms: Pt states she found her old night garment and used it last night.  Pt reports compliance with garments and is wearing one today. Pain Assessment Currently in Pain?: No/denies   Objective: Manual Therapy Manual Therapy: Other (comment) Other Manual Therapy: Pt received manual lymph drainage for [name] routing fluid to Lt axillary and Rt inguinal nodes.  Anterior and posterior anastomosis formed.    Physical Therapy Assessment and Plan PT Assessment and Plan Clinical Impression Statement: No bandages today.  Noted majority of sweling into fingers and wrist where arm sleeve stopped.  Reminded patient to wear her gauntlet to ensure correct pressure for reducing volume.  Pt verbalized understanding.   PT Plan: Pt to continue with decongestive manual techniques.     Problem List Patient Active Problem List   Diagnosis Date Noted  . Lymphedema of arm 03/12/2011    PT - End of Session Activity Tolerance: Patient tolerated treatment well   Lurena Nida, PTA/CLT 11/29/2013, 6:16 PM

## 2013-11-30 ENCOUNTER — Telehealth (HOSPITAL_COMMUNITY): Payer: Self-pay

## 2013-11-30 ENCOUNTER — Ambulatory Visit (HOSPITAL_COMMUNITY)
Admission: RE | Admit: 2013-11-30 | Discharge: 2013-11-30 | Disposition: A | Discharge status: Home / Self Care | Payer: Medicare Other | Source: Ambulatory Visit | Attending: Pulmonary Disease | Admitting: Pulmonary Disease

## 2013-11-30 NOTE — Progress Notes (Addendum)
Physical Therapy Treatment/Discharge  Patient Details  Name: Anita Weiss MRN: [id number] Date of Birth: 09/29/1940  Today's Date: 11/30/2013 Time: 1145-1225 PT Time Calculation (min): 40 min  Visit#: 6 of 12  Re-eval: 12/21/13    Authorization: medicare  Authorization Time Period:    Authorization Visit#: 6 of 6   Subjective: Symptoms/Limitations Symptoms: Pt states that her arm looks better than it has in a long time however she can not afford to continue to come to therapy.  Pt requesting that today be her last day.      Exercise/Treatments Mobility/Balance        Date 11/21/2013  11/28/2013 11/30/2013   Left Right right   MCP 19.10 20.30 20 20  wrist 16.3 16.8 17.70 17.40  4cm 16.30 20.01 19.60 19.60  8cm 18.00 23.50 23.00 22.20  12 cm 21.00 26.60 26.20 25.10  16cm 23.80 29.00 28.80 27.50  20cm 25.00 29.80 29.80 29.60  24cm 25.20 28.20 29.00 29.20  28cm 25.30 28.80 29.30 29.40  32cm 27.00 29.50 29.60 28.80  36cm 28.50 29.20 29.30 29.50  40cm 30.90 30.90 31.50 29.40  44cm      48cm      52cm      56cm      60cm      64cm            Sum of squares 6623.82 8385.96 8456.76 8123.23  Total Volume 2108.428 2669.335 2691.8713 2585.7053            Manual Therapy Manual Therapy: Edema management Other Manual Therapy: manual decongestive theapy includng supraclavicular, deep and superficial abdominal, routing fluid from Rt axillary to Lt axillary and Rt inguinal nodes followed by [name]. Pt then placed comprssion wrap on after session.  Physical Therapy Assessment and Plan PT Assessment and Plan Clinical Impression Statement: Pt volumes decreased from last measurement by 106 cc.    Pt has scratched her hand itching and fluid is leaking but pt reports no pain and there is no increase temperature. Pt advised to keep an eye and at any sign of infeciton contact MD for antibiotic. PT Plan: discharge per patient request.     Goals PT Short Term Goals Time to Complete  Short Term Goals: 2 weeks PT Short Term Goal 1 - Progress: Progressing toward goal PT Short Term Goal 2: Pt to verbalize the importance of skin care with lymphedema PT Short Term Goal 2 - Progress: Met PT Long Term Goals Time to Complete Long Term Goals: 4 weeks PT Long Term Goal 1: volume decreased 80% to allow pt greater comfort for a higher quality of life. PT Long Term Goal 1 - Progress: Progressing toward goal PT Long Term Goal 2: Pt to be self massaging at night. PT Long Term Goal 2 - Progress: Met  Problem List Patient Active Problem List   Diagnosis Date Noted  . Lymphedema of arm 03/12/2011    PT - End of Session Activity Tolerance: Patient tolerated treatment well  GP Functional Assessment Tool Used: clinical judgement Functional Limitation: Other PT primary Other PT Primary Goal Status (G8991): At least 1 percent but less than 20 percent impaired, limited or restricted Other PT Primary Discharge Status (G8992): At least 1 percent but less than 20 percent impaired, limited or restricted  ,CINDY 11/30/2013, 1:41 PM  

## 2013-12-05 ENCOUNTER — Ambulatory Visit (HOSPITAL_COMMUNITY): Payer: Medicare Other | Admitting: Physical Therapy

## 2013-12-06 ENCOUNTER — Ambulatory Visit (HOSPITAL_COMMUNITY): Payer: Medicare Other | Admitting: Physical Therapy

## 2013-12-07 ENCOUNTER — Ambulatory Visit (HOSPITAL_COMMUNITY): Payer: Medicare Other | Admitting: Physical Therapy

## 2013-12-12 ENCOUNTER — Ambulatory Visit (HOSPITAL_COMMUNITY): Payer: Medicare Other | Admitting: Physical Therapy

## 2013-12-13 ENCOUNTER — Ambulatory Visit (HOSPITAL_COMMUNITY): Payer: Medicare Other | Admitting: Physical Therapy

## 2013-12-14 ENCOUNTER — Ambulatory Visit (HOSPITAL_COMMUNITY): Payer: Medicare Other | Admitting: Physical Therapy

## 2015-06-13 DIAGNOSIS — L298 Other pruritus: Secondary | ICD-10-CM | POA: Diagnosis not present

## 2015-06-13 DIAGNOSIS — L302 Cutaneous autosensitization: Secondary | ICD-10-CM | POA: Diagnosis not present

## 2015-06-13 DIAGNOSIS — L309 Dermatitis, unspecified: Secondary | ICD-10-CM | POA: Diagnosis not present

## 2015-06-13 DIAGNOSIS — L281 Prurigo nodularis: Secondary | ICD-10-CM | POA: Diagnosis not present

## 2016-01-28 ENCOUNTER — Other Ambulatory Visit (HOSPITAL_COMMUNITY): Payer: Self-pay | Admitting: Pulmonary Disease

## 2016-01-28 ENCOUNTER — Ambulatory Visit (HOSPITAL_COMMUNITY)
Admission: RE | Admit: 2016-01-28 | Discharge: 2016-01-28 | Disposition: A | Discharge status: Home / Self Care | Payer: Medicare Other | Source: Ambulatory Visit | Attending: Pulmonary Disease | Admitting: Pulmonary Disease

## 2016-01-28 DIAGNOSIS — N132 Hydronephrosis with renal and ureteral calculous obstruction: Secondary | ICD-10-CM | POA: Diagnosis not present

## 2016-01-28 DIAGNOSIS — R112 Nausea with vomiting, unspecified: Secondary | ICD-10-CM | POA: Diagnosis present

## 2016-01-28 DIAGNOSIS — I700 Atherosclerosis of aorta: Secondary | ICD-10-CM | POA: Insufficient documentation

## 2016-01-28 DIAGNOSIS — R1031 Right lower quadrant pain: Secondary | ICD-10-CM | POA: Diagnosis present

## 2016-01-28 LAB — POCT I-STAT CREATININE: Creatinine, Ser: 0.7 mg/dL (ref 0.44–1.00)

## 2016-01-28 MED ORDER — IOPAMIDOL (ISOVUE-300) INJECTION 61%
100.0000 mL | Freq: Once | INTRAVENOUS | Status: AC | PRN
  Administered 2016-01-28: 100 mL via INTRAVENOUS

## 2017-07-14 ENCOUNTER — Other Ambulatory Visit (HOSPITAL_COMMUNITY): Payer: Self-pay | Admitting: Pulmonary Disease

## 2017-07-14 ENCOUNTER — Ambulatory Visit (HOSPITAL_COMMUNITY)
Admission: RE | Admit: 2017-07-14 | Discharge: 2017-07-14 | Disposition: A | Discharge status: Home / Self Care | Payer: Medicare Other | Source: Ambulatory Visit | Attending: Pulmonary Disease | Admitting: Pulmonary Disease

## 2017-07-14 DIAGNOSIS — R059 Cough, unspecified: Secondary | ICD-10-CM

## 2017-07-14 DIAGNOSIS — R05 Cough: Secondary | ICD-10-CM

## 2017-07-14 DIAGNOSIS — N200 Calculus of kidney: Secondary | ICD-10-CM | POA: Insufficient documentation

## 2017-09-29 ENCOUNTER — Other Ambulatory Visit (HOSPITAL_COMMUNITY): Payer: Self-pay | Admitting: Pulmonary Disease

## 2017-09-29 ENCOUNTER — Ambulatory Visit (HOSPITAL_COMMUNITY)
Admission: RE | Admit: 2017-09-29 | Discharge: 2017-09-29 | Disposition: A | Discharge status: Home / Self Care | Payer: Medicare Other | Source: Ambulatory Visit | Attending: Pulmonary Disease | Admitting: Pulmonary Disease

## 2017-09-29 DIAGNOSIS — R519 Headache, unspecified: Secondary | ICD-10-CM

## 2017-09-29 DIAGNOSIS — R51 Headache: Secondary | ICD-10-CM | POA: Insufficient documentation

## 2017-09-29 DIAGNOSIS — R42 Dizziness and giddiness: Secondary | ICD-10-CM | POA: Insufficient documentation

## 2017-09-29 DIAGNOSIS — R110 Nausea: Secondary | ICD-10-CM

## 2019-04-13 IMAGING — DX DG CHEST 2V
2 series · 2 of 2 positions shown · non-contrast
Comparison: None.

CLINICAL DATA: Cough for a few days

EXAM:
CHEST  2 VIEW

[chest pa]
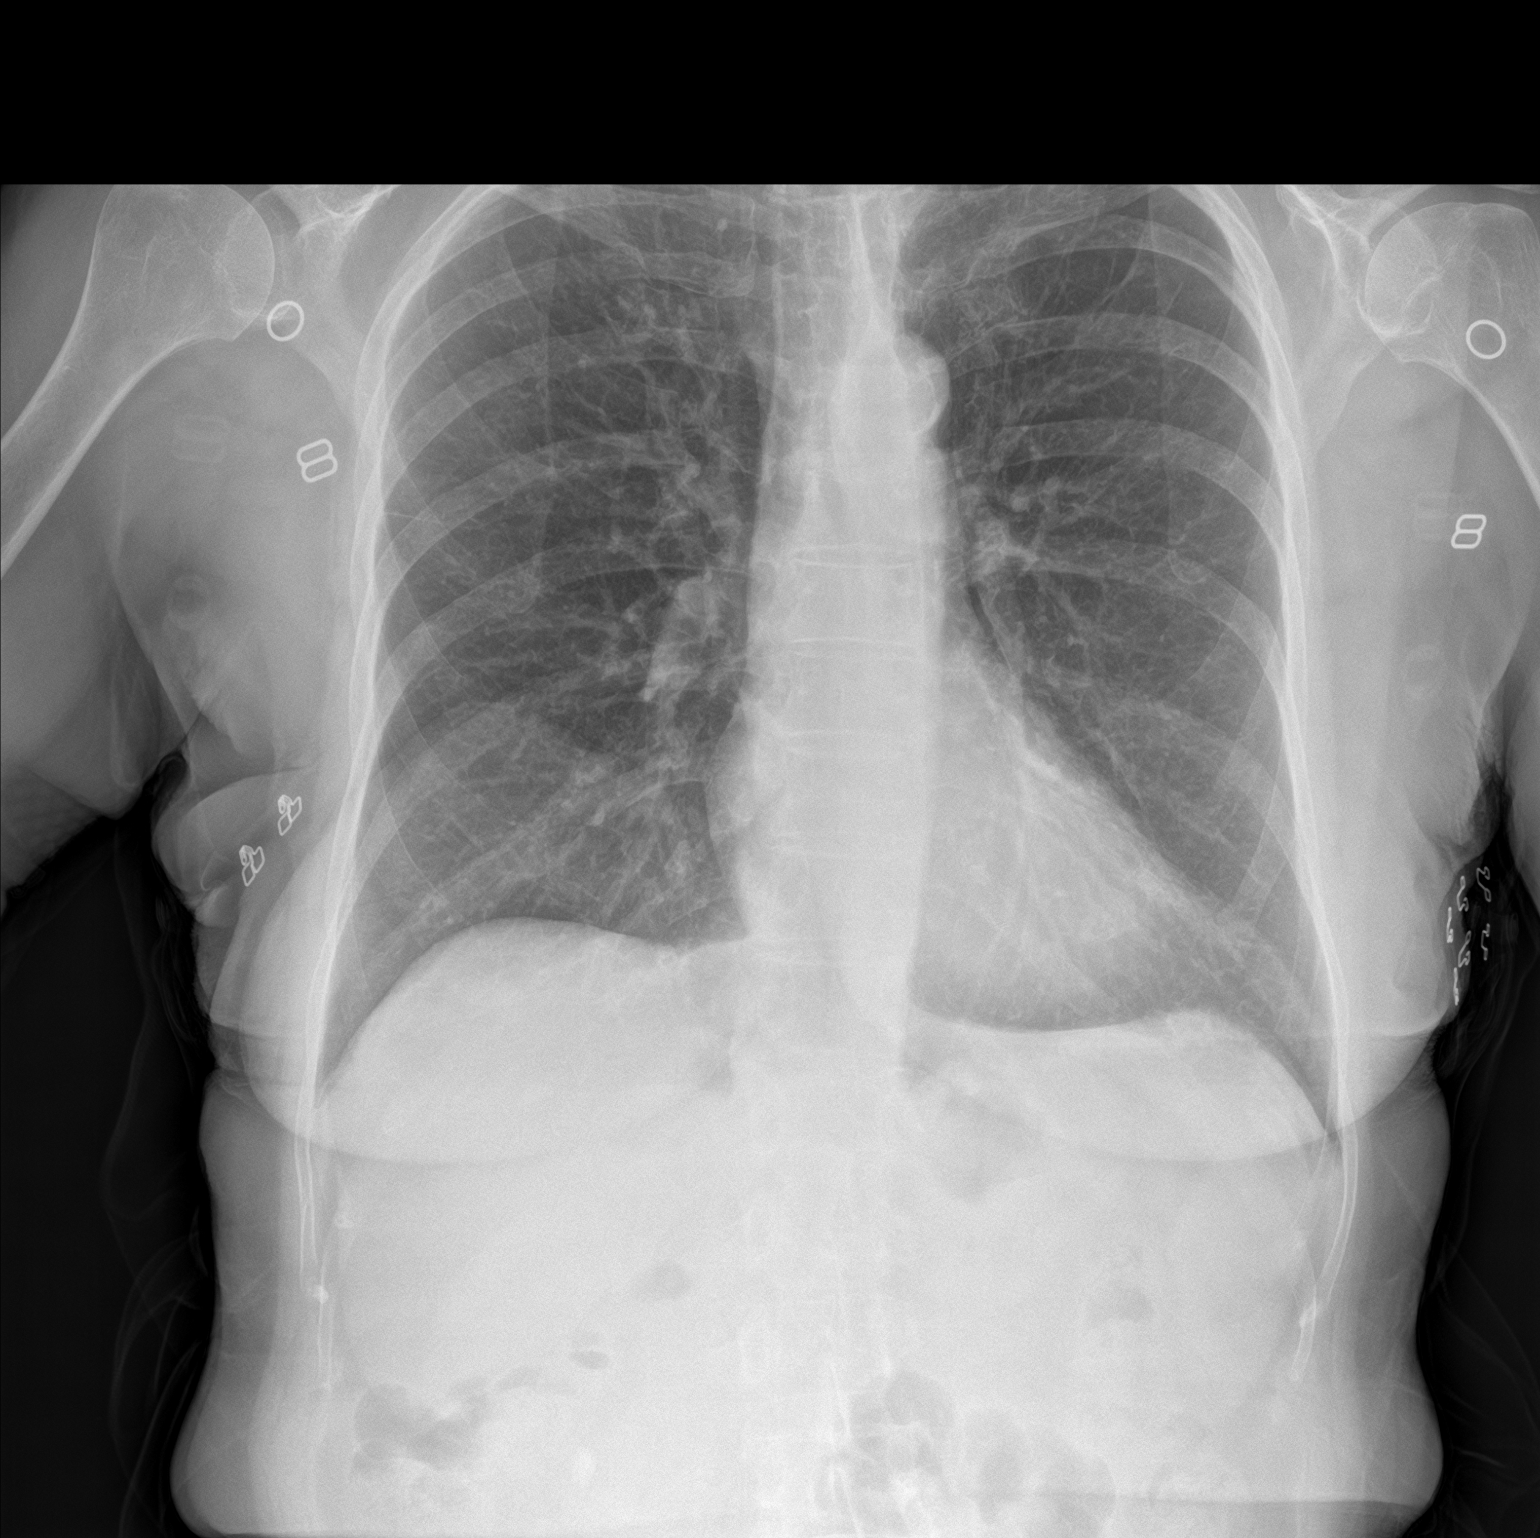

[chest lat]
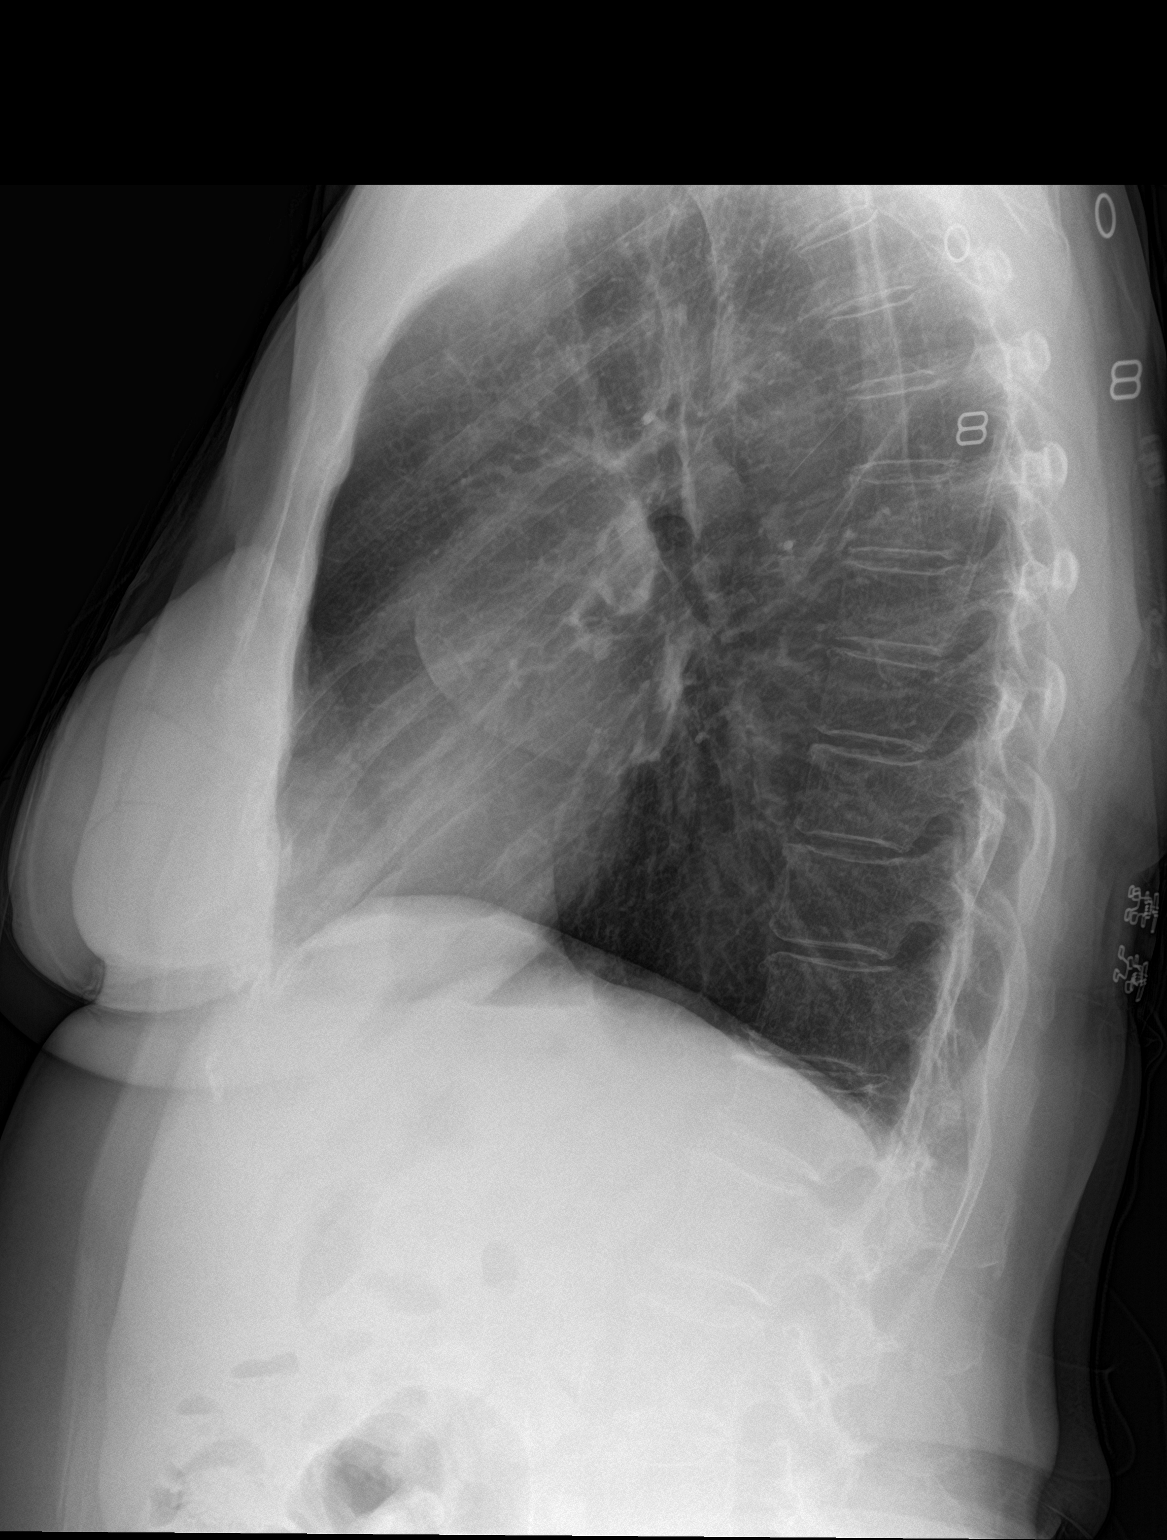

[2 of 2 positions shown; findings below may reference images not displayed]

FINDINGS: Normal heart size and mediastinal contours. There is no edema,
consolidation, effusion, or pneumothorax. Remote appearing L1 mild
compression deformity. 9 mm calcification over the right flank that
is renal based on 01/28/2016 abdominal CT.
IMPRESSION: 1. No evidence of active disease.
2. 9 mm right renal calculus.

## 2020-03-11 DIAGNOSIS — H2512 Age-related nuclear cataract, left eye: Secondary | ICD-10-CM | POA: Diagnosis not present

## 2020-03-11 DIAGNOSIS — H25012 Cortical age-related cataract, left eye: Secondary | ICD-10-CM | POA: Diagnosis not present

## 2020-03-11 DIAGNOSIS — H35373 Puckering of macula, bilateral: Secondary | ICD-10-CM | POA: Diagnosis not present

## 2020-03-11 DIAGNOSIS — Z961 Presence of intraocular lens: Secondary | ICD-10-CM | POA: Diagnosis not present

## 2020-05-14 DIAGNOSIS — I10 Essential (primary) hypertension: Secondary | ICD-10-CM | POA: Diagnosis not present

## 2020-05-14 DIAGNOSIS — E559 Vitamin D deficiency, unspecified: Secondary | ICD-10-CM | POA: Diagnosis not present

## 2020-05-14 DIAGNOSIS — E039 Hypothyroidism, unspecified: Secondary | ICD-10-CM | POA: Diagnosis not present

## 2020-05-28 DIAGNOSIS — E559 Vitamin D deficiency, unspecified: Secondary | ICD-10-CM | POA: Diagnosis not present

## 2020-05-28 DIAGNOSIS — E782 Mixed hyperlipidemia: Secondary | ICD-10-CM | POA: Diagnosis not present

## 2020-05-28 DIAGNOSIS — G4700 Insomnia, unspecified: Secondary | ICD-10-CM | POA: Diagnosis not present

## 2020-05-28 DIAGNOSIS — I10 Essential (primary) hypertension: Secondary | ICD-10-CM | POA: Diagnosis not present

## 2020-05-28 DIAGNOSIS — Z853 Personal history of malignant neoplasm of breast: Secondary | ICD-10-CM | POA: Diagnosis not present

## 2020-05-28 DIAGNOSIS — E039 Hypothyroidism, unspecified: Secondary | ICD-10-CM | POA: Diagnosis not present

## 2020-11-07 DIAGNOSIS — H2512 Age-related nuclear cataract, left eye: Secondary | ICD-10-CM | POA: Diagnosis not present

## 2020-11-07 DIAGNOSIS — H25012 Cortical age-related cataract, left eye: Secondary | ICD-10-CM | POA: Diagnosis not present

## 2020-11-07 DIAGNOSIS — Z961 Presence of intraocular lens: Secondary | ICD-10-CM | POA: Diagnosis not present

## 2020-11-07 DIAGNOSIS — H35373 Puckering of macula, bilateral: Secondary | ICD-10-CM | POA: Diagnosis not present

## 2020-12-05 DIAGNOSIS — E782 Mixed hyperlipidemia: Secondary | ICD-10-CM | POA: Diagnosis not present

## 2020-12-05 DIAGNOSIS — Z0000 Encounter for general adult medical examination without abnormal findings: Secondary | ICD-10-CM | POA: Diagnosis not present

## 2020-12-05 DIAGNOSIS — I10 Essential (primary) hypertension: Secondary | ICD-10-CM | POA: Diagnosis not present

## 2020-12-19 DIAGNOSIS — I10 Essential (primary) hypertension: Secondary | ICD-10-CM | POA: Diagnosis not present

## 2020-12-19 DIAGNOSIS — F5109 Other insomnia not due to a substance or known physiological condition: Secondary | ICD-10-CM | POA: Diagnosis not present

## 2020-12-19 DIAGNOSIS — E039 Hypothyroidism, unspecified: Secondary | ICD-10-CM | POA: Diagnosis not present

## 2020-12-19 DIAGNOSIS — L209 Atopic dermatitis, unspecified: Secondary | ICD-10-CM | POA: Diagnosis not present

## 2020-12-19 DIAGNOSIS — E559 Vitamin D deficiency, unspecified: Secondary | ICD-10-CM | POA: Diagnosis not present

## 2020-12-19 DIAGNOSIS — E782 Mixed hyperlipidemia: Secondary | ICD-10-CM | POA: Diagnosis not present

## 2020-12-19 DIAGNOSIS — R944 Abnormal results of kidney function studies: Secondary | ICD-10-CM | POA: Diagnosis not present

## 2021-04-22 DIAGNOSIS — H3581 Retinal edema: Secondary | ICD-10-CM | POA: Diagnosis not present

## 2021-04-22 DIAGNOSIS — Z961 Presence of intraocular lens: Secondary | ICD-10-CM | POA: Diagnosis not present

## 2021-04-22 DIAGNOSIS — H25042 Posterior subcapsular polar age-related cataract, left eye: Secondary | ICD-10-CM | POA: Diagnosis not present

## 2021-04-22 DIAGNOSIS — H35373 Puckering of macula, bilateral: Secondary | ICD-10-CM | POA: Diagnosis not present

## 2021-04-22 DIAGNOSIS — H2512 Age-related nuclear cataract, left eye: Secondary | ICD-10-CM | POA: Diagnosis not present

## 2021-06-01 ENCOUNTER — Ambulatory Visit (INDEPENDENT_AMBULATORY_CARE_PROVIDER_SITE_OTHER): Payer: Medicare PPO

## 2021-06-01 ENCOUNTER — Ambulatory Visit
Admission: EM | Admit: 2021-06-01 | Discharge: 2021-06-01 | Disposition: A | Discharge status: Home / Self Care | Payer: Medicare PPO

## 2021-06-01 ENCOUNTER — Other Ambulatory Visit: Payer: Self-pay

## 2021-06-01 ENCOUNTER — Encounter: Payer: Self-pay | Admitting: Emergency Medicine

## 2021-06-01 DIAGNOSIS — R059 Cough, unspecified: Secondary | ICD-10-CM | POA: Diagnosis not present

## 2021-06-01 DIAGNOSIS — J3489 Other specified disorders of nose and nasal sinuses: Secondary | ICD-10-CM

## 2021-06-01 DIAGNOSIS — J069 Acute upper respiratory infection, unspecified: Secondary | ICD-10-CM | POA: Diagnosis not present

## 2021-06-01 DIAGNOSIS — R053 Chronic cough: Secondary | ICD-10-CM | POA: Diagnosis not present

## 2021-06-01 HISTORY — DX: Essential (primary) hypertension: I10

## 2021-06-01 HISTORY — DX: Disorder of thyroid, unspecified: E07.9

## 2021-06-01 MED ORDER — CETIRIZINE HCL 5 MG PO TABS: 5.0000 mg | ORAL_TABLET | Freq: Every day | ORAL | 0 refills | Status: AC

## 2021-06-01 MED ORDER — BENZONATATE 100 MG PO CAPS: 100.0000 mg | ORAL_CAPSULE | Freq: Three times a day (TID) | ORAL | 0 refills | Status: AC | PRN

## 2021-06-01 MED ORDER — PSEUDOEPHEDRINE HCL 30 MG PO TABS: 30.0000 mg | ORAL_TABLET | Freq: Three times a day (TID) | ORAL | 0 refills | Status: AC | PRN

## 2021-06-01 MED ORDER — PROMETHAZINE-DM 6.25-15 MG/5ML PO SYRP: 5.0000 mL | ORAL_SOLUTION | Freq: Every evening | ORAL | 0 refills | Status: AC | PRN

## 2021-06-01 NOTE — Discharge Instructions (Signed)
We will notify you of your test results as they arrive and may take between 48-72 hours.  I encourage you to sign up for MyChart if you have not already done so as this can be the easiest way for us to communicate results to you online or through a phone app.  Generally, we only contact you if it is a positive test result.  In the meantime, if you develop worsening symptoms including fever, chest pain, shortness of breath despite our current treatment plan then please report to the emergency room as this may be a sign of worsening status from possible viral infection. ° °Otherwise, we will manage this as a viral syndrome. For sore throat or cough try using a honey-based tea. Use 3 teaspoons of honey with juice squeezed from half lemon. Place shaved pieces of ginger into 1/2-1 cup of water and warm over stove top. Then mix the ingredients and repeat every 4 hours as needed. Please take Tylenol 500mg-650mg every 6 hours for aches and pains, fevers. Hydrate very well with at least 2 liters of water. Eat light meals such as soups to replenish electrolytes and soft fruits, veggies. Start an antihistamine like Zyrtec for postnasal drainage, sinus congestion.  You can take this together with pseudoephedrine (Sudafed) at a dose of 30 mg 2-3 times a day as needed for the same kind of congestion.  Use cough medications as needed.  °

## 2021-06-01 NOTE — ED Triage Notes (Signed)
Runny nose, cough x 2 weeks.  Has tried over the counter products to relief cough without any relief.

## 2021-06-01 NOTE — ED Provider Notes (Signed)
[facility]   MRN: [id number] DOB: October 19, 1940  Subjective:   Anita Weiss is a 80 y.o. female presenting for 2-week history of persistent and worsening cough with midsternal chest pressure, runny and stuffy nose.  Has used multiple over-the-counter medications without relief.  She did have exposure to COVID-19 about a week ago.  Denies any history of respiratory disorders.  No shortness of breath or wheezing.  She does have a history of bronchitis.  Also has a remote history of smoking.  No current facility-administered medications for this encounter.  Current Outpatient Medications:    atenolol (TENORMIN) 50 MG tablet, Take 75 mg by mouth daily., Disp: , Rfl:    levothyroxine (SYNTHROID) 25 MCG tablet, Take 25 mcg by mouth daily before breakfast., Disp: , Rfl:    Allergies  Allergen Reactions   Codeine     Past Medical History:  Diagnosis Date   Hypertension    Thyroid disease      History reviewed. No pertinent surgical history.  History reviewed. No pertinent family history.  Social History   Tobacco Use   Smoking status: Former    Types: Cigarettes   Smokeless tobacco: Never  Substance Use Topics   Alcohol use: Not Currently   Drug use: Never    ROS   Objective:   Vitals: BP 137/68 (BP Location: Right Arm)   Pulse 74   Temp 98.9 F (37.2 C) (Oral)   Resp 18   SpO2 97%   Physical Exam Constitutional:      General: She is not in acute distress.    Appearance: Normal appearance. She is well-developed. She is not ill-appearing, toxic-appearing or diaphoretic.  HENT:     Head: Normocephalic and atraumatic.     Nose: Nose normal.     Mouth/Throat:     Mouth: Mucous membranes are moist.  Eyes:     Extraocular Movements: Extraocular movements intact.     Pupils: Pupils are equal, round, and reactive to light.  Cardiovascular:     Rate and Rhythm: Normal rate and regular rhythm.     Pulses: Normal pulses.     Heart sounds: Normal  heart sounds. No murmur heard.   No friction rub. No gallop.  Pulmonary:     Effort: Pulmonary effort is normal. No respiratory distress.     Breath sounds: Normal breath sounds. No stridor. No wheezing, rhonchi or rales.  Skin:    General: Skin is warm and dry.     Findings: No rash.  Neurological:     Mental Status: She is alert and oriented to person, place, and time.  Psychiatric:        Mood and Affect: Mood normal.        Behavior: Behavior normal.        Thought Content: Thought content normal.    DG Chest 2 View  Result Date: 06/01/2021 CLINICAL DATA:  Cough EXAM: CHEST - 2 VIEW COMPARISON:  07/14/2017 FINDINGS: The heart size and mediastinal contours are within normal limits. Atherosclerotic calcification of the aortic knob. No focal airspace consolidation, pleural effusion, or pneumothorax. The visualized skeletal structures are unremarkable. IMPRESSION: No active cardiopulmonary disease. Electronically Signed   By: Duanne Guess D.O.   On: 06/01/2021 15:04     Assessment and Plan :   PDMP not reviewed this encounter.  1. Viral upper respiratory infection   2. Persistent cough   3. Sinus drainage    Patient has a clear cardiopulmonary exam, negative chest  x-ray.  I reassured her about this.  COVID and flu test pending.  Recommended conservative management.  Patient prefers this as well. Counseled patient on potential for adverse effects with medications prescribed/recommended today, ER and return-to-clinic precautions discussed, patient verbalized understanding.    Wallis Bamberg, PA-C 06/01/21 1514

## 2021-06-03 ENCOUNTER — Encounter (INDEPENDENT_AMBULATORY_CARE_PROVIDER_SITE_OTHER): Payer: Medicare PPO | Admitting: Ophthalmology

## 2021-06-04 LAB — COVID-19, FLU A+B NAA
Influenza A, NAA: NOT DETECTED
Influenza B, NAA: NOT DETECTED
SARS-CoV-2, NAA: DETECTED — AB

## 2021-06-05 ENCOUNTER — Encounter (INDEPENDENT_AMBULATORY_CARE_PROVIDER_SITE_OTHER): Payer: Medicare PPO | Admitting: Ophthalmology

## 2021-07-02 DIAGNOSIS — E559 Vitamin D deficiency, unspecified: Secondary | ICD-10-CM | POA: Diagnosis not present

## 2021-07-02 DIAGNOSIS — E039 Hypothyroidism, unspecified: Secondary | ICD-10-CM | POA: Diagnosis not present

## 2021-07-02 DIAGNOSIS — E782 Mixed hyperlipidemia: Secondary | ICD-10-CM | POA: Diagnosis not present

## 2021-07-10 DIAGNOSIS — I10 Essential (primary) hypertension: Secondary | ICD-10-CM | POA: Diagnosis not present

## 2021-07-10 DIAGNOSIS — R944 Abnormal results of kidney function studies: Secondary | ICD-10-CM | POA: Diagnosis not present

## 2021-07-10 DIAGNOSIS — L209 Atopic dermatitis, unspecified: Secondary | ICD-10-CM | POA: Diagnosis not present

## 2021-07-10 DIAGNOSIS — F5109 Other insomnia not due to a substance or known physiological condition: Secondary | ICD-10-CM | POA: Diagnosis not present

## 2021-07-10 DIAGNOSIS — E782 Mixed hyperlipidemia: Secondary | ICD-10-CM | POA: Diagnosis not present

## 2021-07-10 DIAGNOSIS — E559 Vitamin D deficiency, unspecified: Secondary | ICD-10-CM | POA: Diagnosis not present

## 2021-07-10 DIAGNOSIS — J302 Other seasonal allergic rhinitis: Secondary | ICD-10-CM | POA: Diagnosis not present

## 2021-07-10 DIAGNOSIS — E039 Hypothyroidism, unspecified: Secondary | ICD-10-CM | POA: Diagnosis not present

## 2021-07-10 DIAGNOSIS — R059 Cough, unspecified: Secondary | ICD-10-CM | POA: Diagnosis not present

## 2021-08-11 ENCOUNTER — Other Ambulatory Visit: Payer: Self-pay

## 2021-08-11 ENCOUNTER — Encounter (INDEPENDENT_AMBULATORY_CARE_PROVIDER_SITE_OTHER): Payer: Self-pay | Admitting: Ophthalmology

## 2021-08-11 ENCOUNTER — Ambulatory Visit (INDEPENDENT_AMBULATORY_CARE_PROVIDER_SITE_OTHER): Payer: Medicare PPO | Admitting: Ophthalmology

## 2021-08-11 DIAGNOSIS — H35373 Puckering of macula, bilateral: Secondary | ICD-10-CM | POA: Diagnosis not present

## 2021-08-11 DIAGNOSIS — H35371 Puckering of macula, right eye: Secondary | ICD-10-CM | POA: Diagnosis not present

## 2021-08-11 DIAGNOSIS — H2512 Age-related nuclear cataract, left eye: Secondary | ICD-10-CM | POA: Diagnosis not present

## 2021-08-11 DIAGNOSIS — H35372 Puckering of macula, left eye: Secondary | ICD-10-CM | POA: Diagnosis not present

## 2021-08-11 NOTE — Assessment & Plan Note (Signed)
Topographic distortion accounting for acuity from epiretinal membrane.  Explained the patient vitrectomy membrane peel typically takes in my cases anywhere from 15 to 25 minutes to complete.  This will be under local MAC anesthesia probably similar to her cataract surgery experience.  Also explained that may very well at the close of the procedure, perform some anterior segment reconstruction to remove the iris that is also in tangled in the cornea temporally

## 2021-08-11 NOTE — Progress Notes (Signed)
08/11/2021     CHIEF COMPLAINT Patient presents for  Chief Complaint  Patient presents with   Retina Evaluation      HISTORY OF PRESENT ILLNESS: Anita Weiss is a 81 y.o. female who presents to the clinic today for:   HPI   NP- intraretinal retinal fluid, worsened in May '22, referred by Dr. Lucianne Lei. Pt states "vision is stable. I went to Dr. Lucianne Lei for my year checkup and she said there is something going on in my left eye. There is a strange design in the corner of I think both eyes, and it has different colors that comes and goes but it has been there about 3 years." Cataract removed 3 years ago in the right eye, done by Dr. Kathlen Mody. Last edited by Laurin Coder on 08/11/2021  2:26 PM.      Referring physician: Lisabeth Pick, MD [place],  [place] [id number]  HISTORICAL INFORMATION:   Selected notes from the MEDICAL RECORD NUMBER       CURRENT MEDICATIONS: No current outpatient medications on file. (Ophthalmic Drugs)   No current facility-administered medications for this visit. (Ophthalmic Drugs)   Current Outpatient Medications (Other)  Medication Sig   atenolol (TENORMIN) 50 MG tablet Take 75 mg by mouth daily.   benzonatate (TESSALON) 100 MG capsule Take 1-2 capsules (100-200 mg total) by mouth 3 (three) times daily as needed for cough.   cetirizine (ZYRTEC) 5 MG tablet Take 1 tablet (5 mg total) by mouth daily.   levothyroxine (SYNTHROID) 25 MCG tablet Take 25 mcg by mouth daily before breakfast.   promethazine-dextromethorphan (PROMETHAZINE-DM) 6.25-15 MG/5ML syrup Take 5 mLs by mouth at bedtime as needed for cough.   pseudoephedrine (SUDAFED) 30 MG tablet Take 1 tablet (30 mg total) by mouth every 8 (eight) hours as needed for congestion.   No current facility-administered medications for this visit. (Other)      REVIEW OF SYSTEMS:    ALLERGIES Allergies  Allergen Reactions   Codeine     PAST MEDICAL HISTORY Past Medical  History:  Diagnosis Date   Hypertension    Thyroid disease    History reviewed. No pertinent surgical history.  FAMILY HISTORY History reviewed. No pertinent family history.  SOCIAL HISTORY Social History   Tobacco Use   Smoking status: Former    Types: Cigarettes   Smokeless tobacco: Never  Substance Use Topics   Alcohol use: Not Currently   Drug use: Never         OPHTHALMIC EXAM:  Base Eye Exam     Visual Acuity (ETDRS)       Right Left   Dist cc 20/80 -2 20/100   Dist ph cc NI NI    Correction: Glasses         Tonometry (Tonopen, 2:32 PM)       Right Left   Pressure 10 15         Pupils       Dark Light Shape React APD   Right   Irregular Minimal None   Left 3 3 Round Minimal None         Visual Fields (Counting fingers)       Left Right    Full Full         Extraocular Movement       Right Left    Full Full         Neuro/Psych     Oriented x3: Yes  Mood/Affect: Normal         Dilation     Both eyes: 1.0% Mydriacyl, 2.5% Phenylephrine @ 2:32 PM           Slit Lamp and Fundus Exam     Slit Lamp Exam       Right Left   Lids/Lashes Normal Normal   Conjunctiva/Sclera White and quiet White and quiet   Cornea Clear Clear   Anterior Chamber Shallow temporally with iris to old cataract wound Deep and quiet   Iris Posterior synechia to the lens, at 6:00 meridian, with iris defect, and with iris adhesion anterior to the corneal wound starting at 9:00 Round and reactive   Lens Centered posterior chamber intraocular lens 3+ Nuclear sclerosis   Anterior Vitreous Clear Normal         Fundus Exam       Right Left   Posterior Vitreous Normal Normal   Disc Normal Normal   C/D Ratio 0.25 0.3   Macula Epiretinal membrane, with severe topographic distortion Poor detail due to cataract   Vessels Normal Normal   Periphery Normal Normal            IMAGING AND PROCEDURES  Imaging and Procedures for  08/11/21  OCT, Retina - OU - Both Eyes       Right Eye Quality was good. Scan locations included subfoveal. Central Foveal Thickness: 387. Findings include epiretinal membrane.   Left Eye Quality was good. Scan locations included subfoveal. Central Foveal Thickness: 304. Findings include epiretinal membrane.   Notes Severe, persistent epiretinal membrane with severe topographic distortion accounts for acuity     Color Fundus Photography Optos - OU - Both Eyes       Right Eye Progression has no prior data. Disc findings include normal observations. Vessels : normal observations. Periphery : normal observations.   Left Eye Progression has no prior data. Disc findings include normal observations. Macula : normal observations. Vessels : normal observations. Periphery : normal observations.   Notes Severe macular topographic distortion, right eye  Severe cloudiness and media corresponding to  Cataract OS               ASSESSMENT/PLAN:  Right epiretinal membrane Topographic distortion accounting for acuity from epiretinal membrane.  Explained the patient vitrectomy membrane peel typically takes in my cases anywhere from 15 to 25 minutes to complete.  This will be under local MAC anesthesia probably similar to her cataract surgery experience.  Also explained that may very well at the close of the procedure, perform some anterior segment reconstruction to remove the iris that is also in tangled in the cornea temporally     ICD-10-CM   1. Right epiretinal membrane  H35.371 OCT, Retina - OU - Both Eyes    Color Fundus Photography Optos - OU - Both Eyes    2. Left epiretinal membrane  H35.372 OCT, Retina - OU - Both Eyes    Color Fundus Photography Optos - OU - Both Eyes      OD with severe epiretinal membrane, patient requires surgical intervention in the right in order to allow for the best visual acuity recovery.  2.  OS will need cataract surgery so as to maximize  visual potential  3.  OS has minor epiretinal membrane not impactful of vision in the left eye at this time continue to observe this condition in the left eye  4.  Patient reports that she may have difficulty having a person to be  with her and stay with her and offered transportation for surgical intervention in the future  Ophthalmic Meds Ordered this visit:  No orders of the defined types were placed in this encounter.      Return ,, [facility],,,,, for Schedule vitrectomy membrane [phone].  There are no Patient Instructions on file for this visit.   Explained the diagnoses, plan, and follow up with the patient and they expressed understanding.  Patient expressed understanding of the importance of proper follow up care.   Clent Demark Ivet Guerrieri M.D. Diseases & Surgery of the Retina and Vitreous [facility] [place] 08/11/21     Abbreviations: M myopia (nearsighted); A astigmatism; H hyperopia (farsighted); P presbyopia; Mrx spectacle prescription;  CTL contact lenses; OD right eye; OS left eye; OU both eyes  XT exotropia; ET esotropia; PEK punctate epithelial keratitis; PEE punctate epithelial erosions; DES dry eye syndrome; MGD meibomian gland dysfunction; ATs artificial tears; PFAT's preservative free artificial tears; [place] nuclear sclerotic cataract; PSC posterior subcapsular cataract; ERM epi-retinal membrane; PVD posterior vitreous detachment; RD retinal detachment; DM diabetes mellitus; DR diabetic retinopathy; NPDR non-proliferative diabetic retinopathy; PDR proliferative diabetic retinopathy; CSME clinically significant macular edema; DME diabetic macular edema; dbh dot blot hemorrhages; CWS cotton wool spot; POAG primary open angle glaucoma; C/D cup-to-disc ratio; HVF humphrey visual field; GVF goldmann visual field; OCT optical coherence tomography; IOP intraocular pressure; BRVO Branch retinal vein occlusion; CRVO central retinal vein occlusion; CRAO central  retinal artery occlusion; BRAO branch retinal artery occlusion; RT retinal tear; SB scleral buckle; PPV pars plana vitrectomy; VH Vitreous hemorrhage; PRP panretinal laser photocoagulation; IVK intravitreal kenalog; VMT vitreomacular traction; MH Macular hole;  NVD neovascularization of the disc; NVE neovascularization elsewhere; AREDS age related eye disease study; ARMD age related macular degeneration; POAG primary open angle glaucoma; EBMD epithelial/anterior basement membrane dystrophy; ACIOL anterior chamber intraocular lens; IOL intraocular lens; PCIOL posterior chamber intraocular lens; Phaco/IOL phacoemulsification with intraocular lens placement; [place] photorefractive keratectomy; LASIK laser assisted in situ keratomileusis; HTN hypertension; DM diabetes mellitus; COPD chronic obstructive pulmonary disease

## 2021-08-11 NOTE — Assessment & Plan Note (Signed)
Severe cataract left eye, will likely need cath extraction intraocular lens placed

## 2021-08-20 ENCOUNTER — Encounter (INDEPENDENT_AMBULATORY_CARE_PROVIDER_SITE_OTHER): Payer: Self-pay

## 2021-08-27 ENCOUNTER — Encounter (INDEPENDENT_AMBULATORY_CARE_PROVIDER_SITE_OTHER): Payer: Self-pay

## 2021-08-27 ENCOUNTER — Other Ambulatory Visit: Payer: Self-pay

## 2021-08-27 ENCOUNTER — Ambulatory Visit (INDEPENDENT_AMBULATORY_CARE_PROVIDER_SITE_OTHER): Payer: Medicare PPO

## 2021-08-27 DIAGNOSIS — H35371 Puckering of macula, right eye: Secondary | ICD-10-CM

## 2021-08-27 NOTE — Progress Notes (Signed)
? ? ?08/27/2021 ? ?  ? ?CHIEF COMPLAINT ?Patient presents for Pre-op Exam ? ? ?HISTORY OF PRESENT ILLNESS: ?Anita Weiss is a 81 y.o. female who presents to the clinic today for:  ? ?HPI   ?Pre op for ERM, right eye, surgery 09/10/2021. ?Patient states vision is stable and unchanged since last visit. Denies any new floaters or FOL. ? ?Last edited by Nelva Nay on 08/27/2021  1:00 PM.  ?  ? ? ? ? ?HISTORICAL INFORMATION:  ? ?Selected notes from the MEDICAL RECORD NUMBER ?  ?   ? ?CURRENT MEDICATIONS: ?No current outpatient medications on file. (Ophthalmic Drugs)  ? ?No current facility-administered medications for this visit. (Ophthalmic Drugs)  ? ?Current Outpatient Medications (Other)  ?Medication Sig  ? atenolol (TENORMIN) 50 MG tablet Take 75 mg by mouth daily.  ? benzonatate (TESSALON) 100 MG capsule Take 1-2 capsules (100-200 mg total) by mouth 3 (three) times daily as needed for cough.  ? cetirizine (ZYRTEC) 5 MG tablet Take 1 tablet (5 mg total) by mouth daily.  ? levothyroxine (SYNTHROID) 25 MCG tablet Take 25 mcg by mouth daily before breakfast.  ? promethazine-dextromethorphan (PROMETHAZINE-DM) 6.25-15 MG/5ML syrup Take 5 mLs by mouth at bedtime as needed for cough.  ? pseudoephedrine (SUDAFED) 30 MG tablet Take 1 tablet (30 mg total) by mouth every 8 (eight) hours as needed for congestion.  ? ?No current facility-administered medications for this visit. (Other)  ? ? ? ?ALLERGIES ?Allergies  ?Allergen Reactions  ? Codeine   ? ? ?PAST MEDICAL HISTORY ?Past Medical History:  ?Diagnosis Date  ? Hypertension   ? Thyroid disease   ? ?History reviewed. No pertinent surgical history. ? ?FAMILY HISTORY ?History reviewed. No pertinent family history. ? ?SOCIAL HISTORY ?Social History  ? ?Tobacco Use  ? Smoking status: Former  ?  Types: Cigarettes  ? Smokeless tobacco: Never  ?Substance Use Topics  ? Alcohol use: Not Currently  ? Drug use: Never  ? ?  ? ?  ? ?OPHTHALMIC EXAM: ? ?Base Eye Exam   ? ? Visual Acuity  (ETDRS)   ? ?   Right Left  ? Dist cc 20/100 -1 20/200  ? Dist ph cc NI NI  ? ? Correction: Glasses  ? ?  ?  ? ? Tonometry (Tonopen, 1:01 PM)   ? ?   Right Left  ? Pressure 14   ? ?  ?  ? ? Pupils   ? ?   Dark Light Shape React  ? Right   Irregular Minimal  ? Left 3 3 Round Minimal  ? ?  ?  ? ? Extraocular Movement   ? ?   Right Left  ?  Full Full  ? ?  ?  ? ? Neuro/Psych   ? ? Oriented x3: Yes  ? Mood/Affect: Normal  ? ?  ?  ? ? Dilation   ? ? Both eyes: No dilation @ 1:01 PM  ? ?  ?  ? ?  ? ? ?IMAGING AND PROCEDURES  ?Imaging and Procedures for @TODAY @ ? ? ? ?  ?  ? ?  ?ASSESSMENT/PLAN: ? ?No diagnosis found. ? ?Ophthalmic Meds Ordered this visit:  ?No orders of the defined types were placed in this encounter. ? ? ?  ? ? ?Pre-op completed. Operative consent obtained with pre-op eye drops reviewed with [facility] and sent via [facility] as needed. ?Post op instructions reviewed with patient and per patient all questions answered. ? ?  Nelva Nay ? ? ? ?  ?

## 2021-09-10 ENCOUNTER — Encounter (AMBULATORY_SURGERY_CENTER): Payer: Medicare PPO | Admitting: Ophthalmology

## 2021-09-10 DIAGNOSIS — H35371 Puckering of macula, right eye: Secondary | ICD-10-CM

## 2021-09-10 DIAGNOSIS — H21511 Anterior synechiae (iris), right eye: Secondary | ICD-10-CM | POA: Diagnosis not present

## 2021-09-11 ENCOUNTER — Encounter (INDEPENDENT_AMBULATORY_CARE_PROVIDER_SITE_OTHER): Payer: Medicare PPO | Admitting: Ophthalmology

## 2021-09-11 ENCOUNTER — Ambulatory Visit (INDEPENDENT_AMBULATORY_CARE_PROVIDER_SITE_OTHER): Payer: Medicare PPO | Admitting: Ophthalmology

## 2021-09-11 ENCOUNTER — Other Ambulatory Visit: Payer: Self-pay

## 2021-09-11 ENCOUNTER — Encounter (INDEPENDENT_AMBULATORY_CARE_PROVIDER_SITE_OTHER): Payer: Self-pay | Admitting: Ophthalmology

## 2021-09-11 DIAGNOSIS — H21521 Goniosynechiae, right eye: Secondary | ICD-10-CM | POA: Insufficient documentation

## 2021-09-11 DIAGNOSIS — H35371 Puckering of macula, right eye: Secondary | ICD-10-CM

## 2021-09-11 NOTE — Assessment & Plan Note (Signed)
Few adhesions temporally today ?

## 2021-09-11 NOTE — Progress Notes (Signed)
? ? ?09/11/2021 ? ?  ? ?CHIEF COMPLAINT ?Patient presents for  ?Chief Complaint  ?Patient presents with  ? Post-op Follow-up  ? ? ? ? ?HISTORY OF PRESENT ILLNESS: ?Anita Weiss is a 81 y.o. female who presents to the clinic today for:  ? ?HPI   ? ? Post-op Follow-up   ? ?      ? Laterality: right eye  ? Discomfort: tearing.  Negative for pain, itching, foreign body sensation, discharge, floaters (.) and none  ? Vision: is stable  ? ?  ?  ? ? Comments   ?Pt states no changes in vision ?Pt cannot tell if there are floaters and FOL. Pt stated that when she closes her eyes, she noticed a big black spot in the middle of her vision but doesn't know if it is a Occupational hygienist. ? ?Pt states she is feeling discomfort, only when she opens her eyes. ? ? ?  ?  ?Last edited by Silvestre Moment on 09/11/2021  8:53 AM.  ?  ? ? ?Referring physician: ?Celene Squibb, MD ?[address] ?[address] ?[place],  [place] [address] ? ?HISTORICAL INFORMATION:  ? ?Selected notes from the [facility] ?  ?   ? ?CURRENT MEDICATIONS: ?No current outpatient medications on file. (Ophthalmic Drugs)  ? ?No current facility-administered medications for this visit. (Ophthalmic Drugs)  ? ?Current Outpatient Medications (Other)  ?Medication Sig  ? atenolol (TENORMIN) 50 MG tablet Take 75 mg by mouth daily.  ? benzonatate (TESSALON) 100 MG capsule Take 1-2 capsules (100-200 mg total) by mouth 3 (three) times daily as needed for cough.  ? cetirizine (ZYRTEC) 5 MG tablet Take 1 tablet (5 mg total) by mouth daily.  ? levothyroxine (SYNTHROID) 25 MCG tablet Take 25 mcg by mouth daily before breakfast.  ? promethazine-dextromethorphan (PROMETHAZINE-DM) 6.25-15 MG/5ML syrup Take 5 mLs by mouth at bedtime as needed for cough.  ? pseudoephedrine (SUDAFED) 30 MG tablet Take 1 tablet (30 mg total) by mouth every 8 (eight) hours as needed for congestion.  ? ?No current facility-administered medications for this visit. (Other)  ? ? ? ? ?REVIEW OF SYSTEMS: ?ROS   ?Negative for:  Constitutional, Gastrointestinal, Neurological, Skin, Genitourinary, Musculoskeletal, HENT, Endocrine, Cardiovascular, Eyes, Respiratory, Psychiatric, Allergic/Imm, Heme/Lymph ?Last edited by Silvestre Moment on 09/11/2021  8:53 AM.  ?  ? ? ? ?ALLERGIES ?Allergies  ?Allergen Reactions  ? Codeine   ? ? ?PAST MEDICAL HISTORY ?Past Medical History:  ?Diagnosis Date  ? Hypertension   ? Thyroid disease   ? ?No past surgical history on file. ? ?FAMILY HISTORY ?No family history on file. ? ?SOCIAL HISTORY ?Social History  ? ?Tobacco Use  ? Smoking status: Former  ?  Types: Cigarettes  ? Smokeless tobacco: Never  ?Substance Use Topics  ? Alcohol use: Not Currently  ? Drug use: Never  ? ?  ? ?  ? ?OPHTHALMIC EXAM: ? ?Base Eye Exam   ? ? Visual Acuity (ETDRS)   ? ?   Right Left  ? Dist [place] CF at 3' CF at 3'  ? ?  ?  ? ? Tonometry (Tonopen, 9:07 AM)   ? ?   Right Left  ? Pressure Soft 14  ? ?  ?  ? ? Pupils   ? ?   Pupils APD  ? Right PERRL None  ? Left PERRL None  ? ?  ?  ? ? Visual Fields   ? ?   Left Right  ?  Full   ? ?  ?  ? ? Extraocular Movement   ? ?   Right Left  ?  Full Full  ? ?  ?  ? ? Neuro/Psych   ? ? Oriented x3: Yes  ? Mood/Affect: Normal  ? ?  ?  ? ? Dilation   ? ? Right eye: 1.0% Mydriacyl, 2.5% Phenylephrine @ 9:05 AM  ? ?  ?  ? ?  ? ?Slit Lamp and Fundus Exam   ? ? External Exam   ? ?   Right Left  ? External Normal Normal  ? ?  ?  ? ? Slit Lamp Exam   ? ?   Right Left  ? Lids/Lashes Normal Normal  ? Conjunctiva/Sclera White and quiet White and quiet  ? Cornea Clear Clear  ? Anterior Chamber Shallow temporally with iris to old cataract wound Deep and quiet  ? Iris Posterior synechia to the lens, at 6:00 meridian, with iris defect, and with iris adhesion anterior to the corneal wound starting at 9:00 Round and reactive  ? Lens Centered posterior chamber intraocular lens 3+ Nuclear sclerosis  ? Anterior Vitreous Clear Normal  ? ?  ?  ? ? Fundus Exam   ? ?   Right Left  ? Posterior Vitreous hazy view   ? Disc Normal   ?  C/D Ratio 0.25   ? Macula less topo distortion   ? Vessels Normal   ? Periphery Normal   ? ?  ?  ? ?  ? ? ?IMAGING AND PROCEDURES  ?Imaging and Procedures for 09/11/21 ? ? ? ?  ?  ? ?  ?ASSESSMENT/PLAN: ? ?Iris peripheral anterior synechiae, right ?Few adhesions temporally today ?  ? ?  ICD-10-CM   ?1. Right epiretinal membrane  H35.371   ?  ?2. Iris peripheral anterior synechiae, right  H21.521   ?  ? ? ?1.  Mild vitreous debris OD, topographic distortion to the macula ? ?Thigh somewhat soft, patient structured not to compress mash or rub the eye ? ?2.  Commence with topical medical therapy ? ?3. ? ?Ophthalmic Meds Ordered this visit:  ?No orders of the defined types were placed in this encounter. ? ? ?  ? ?Return in about 1 week (around 09/18/2021) for dilate, OD, POST OP, COLOR FP, OCT. ? ?Patient Instructions  ?Ofloxacin  4 times daily to the operative eye ? ?Prednisolone acetate 1 drop to the operative eye 4 times daily ? ?Patient instructed not to refill the medications and use them for maximum of 3 weeks. ? ?Patient instructed do not rub the eye.  Patient has the option to use the patch at night.  ? ?No lifting and bending for 1 week. No water IN the eye for 10 days. Do not rub the eye. Wear shield at night for 1-3 days.  Continue your topical medications for a total of 3 weeks.  Do not refill your postoperative medications unless instructed. ? ?Refrain from exercise or intentional activity which increases our heart rate above resting levels.  Normal walking to complete normal activities of your day are appropriate. ? ?Driving:  Legally, you only need one good eye, of 20/40 or better to drive.  However, the practice does not recommend driving during first weeks after surgery, IF you are uncomfortable with your visual functioning or capabilities. ? ? ?If you have known sleep apnea, wear your CPAP as you normally should.  ? ? ?Explained the diagnoses, plan, and follow up  with the patient and they expressed  understanding.  Patient expressed understanding of the importance of proper follow up care.  ? ?Clent Demark. Temeca Somma M.D. ?Diseases & Surgery of the Retina and Vitreous ?[place] ?09/11/21 ? ? ? ? ?Abbreviations: ?M myopia (nearsighted); A astigmatism; H hyperopia (farsighted); P presbyopia; Mrx spectacle prescription;  CTL contact lenses; OD right eye; OS left eye; OU both eyes  XT exotropia; ET esotropia; PEK punctate epithelial keratitis; PEE punctate epithelial erosions; DES dry eye syndrome; MGD meibomian gland dysfunction; ATs artificial tears; PFAT's preservative free artificial tears; [place] nuclear sclerotic cataract; PSC posterior subcapsular cataract; ERM epi-retinal membrane; PVD posterior vitreous detachment; RD retinal detachment; DM diabetes mellitus; DR diabetic retinopathy; NPDR non-proliferative diabetic retinopathy; PDR proliferative diabetic retinopathy; CSME clinically significant macular edema; DME diabetic macular edema; dbh dot blot hemorrhages; CWS cotton wool spot; POAG primary open angle glaucoma; C/D cup-to-disc ratio; HVF humphrey visual field; GVF goldmann visual field; OCT optical coherence tomography; IOP intraocular pressure; BRVO Branch retinal vein occlusion; CRVO central retinal vein occlusion; CRAO central retinal artery occlusion; BRAO branch retinal artery occlusion; RT retinal tear; SB scleral buckle; PPV pars plana vitrectomy; VH Vitreous hemorrhage; PRP panretinal laser photocoagulation; IVK intravitreal kenalog; VMT vitreomacular traction; MH Macular hole;  NVD neovascularization of the disc; NVE neovascularization elsewhere; AREDS age related eye disease study; ARMD age related macular degeneration; POAG primary open angle glaucoma; EBMD epithelial/anterior basement membrane dystrophy; ACIOL anterior chamber intraocular lens; IOL intraocular lens; PCIOL posterior chamber intraocular lens; Phaco/IOL phacoemulsification with intraocular lens placement; [place]  photorefractive keratectomy; LASIK laser assisted in situ keratomileusis; HTN hypertension; DM diabetes mellitus; COPD chronic obstructive pulmonary disease ?

## 2021-09-11 NOTE — Patient Instructions (Signed)

## 2021-09-17 ENCOUNTER — Encounter (INDEPENDENT_AMBULATORY_CARE_PROVIDER_SITE_OTHER): Payer: Medicare PPO | Admitting: Ophthalmology

## 2021-09-18 ENCOUNTER — Ambulatory Visit (INDEPENDENT_AMBULATORY_CARE_PROVIDER_SITE_OTHER): Payer: Medicare PPO | Admitting: Ophthalmology

## 2021-09-18 DIAGNOSIS — H35371 Puckering of macula, right eye: Secondary | ICD-10-CM | POA: Diagnosis not present

## 2021-09-18 NOTE — Progress Notes (Signed)
? ? ?09/18/2021 ? ?  ? ?CHIEF COMPLAINT ?Patient presents for  ?Chief Complaint  ?Patient presents with  ? Post-op Follow-up  ? ? ? ? ?HISTORY OF PRESENT ILLNESS: ?Anita Weiss is a 81 y.o. female who presents to the clinic today for:  ? ?HPI   ? ? Post-op Follow-up   ? ?      ? Laterality: right eye  ? Discomfort: itching and tearing.  Negative for pain, foreign body sensation and discharge  ? Vision: is stable  ? ?  ?  ? ? Comments   ?Pt stated, "It is slowly improvement. But if I close my left eye, I still cant read out of right eye. There is some fogginess to it." ?Pt stated she has seen floaters here and there but not today. Pt denies FOL. ?Pt is still taking post ops drops. ? ? ?  ?  ?Last edited by Angeline Slim on 09/18/2021  2:08 PM.  ?  ? ? ?Referring physician: ?Benita Stabile, MD ?[address] ?[address] [place] ?[place],  [place] [address] ? ?HISTORICAL INFORMATION:  ? ?Selected notes from the MEDICAL RECORD NUMBER ?  ?   ? ?CURRENT MEDICATIONS: ?No current outpatient medications on file. (Ophthalmic Drugs)  ? ?No current facility-administered medications for this visit. (Ophthalmic Drugs)  ? ?Current Outpatient Medications (Other)  ?Medication Sig  ? atenolol (TENORMIN) 50 MG tablet Take 75 mg by mouth daily.  ? benzonatate (TESSALON) 100 MG capsule Take 1-2 capsules (100-200 mg total) by mouth 3 (three) times daily as needed for cough.  ? cetirizine (ZYRTEC) 5 MG tablet Take 1 tablet (5 mg total) by mouth daily.  ? levothyroxine (SYNTHROID) 25 MCG tablet Take 25 mcg by mouth daily before breakfast.  ? promethazine-dextromethorphan (PROMETHAZINE-DM) 6.25-15 MG/5ML syrup Take 5 mLs by mouth at bedtime as needed for cough.  ? pseudoephedrine (SUDAFED) 30 MG tablet Take 1 tablet (30 mg total) by mouth every 8 (eight) hours as needed for congestion.  ? ?No current facility-administered medications for this visit. (Other)  ? ? ? ? ?REVIEW OF SYSTEMS: ?ROS   ?Negative for: Constitutional, Gastrointestinal, Neurological, Skin,  Genitourinary, Musculoskeletal, HENT, Endocrine, Cardiovascular, Eyes, Respiratory, Psychiatric, Allergic/Imm, Heme/Lymph ?Last edited by Angeline Slim on 09/18/2021  2:08 PM.  ?  ? ? ? ?ALLERGIES ?Allergies  ?Allergen Reactions  ? Codeine   ? ? ?PAST MEDICAL HISTORY ?Past Medical History:  ?Diagnosis Date  ? Hypertension   ? Thyroid disease   ? ?No past surgical history on file. ? ?FAMILY HISTORY ?No family history on file. ? ?SOCIAL HISTORY ?Social History  ? ?Tobacco Use  ? Smoking status: Former  ?  Types: Cigarettes  ? Smokeless tobacco: Never  ?Substance Use Topics  ? Alcohol use: Not Currently  ? Drug use: Never  ? ?  ? ?  ? ?OPHTHALMIC EXAM: ? ?Base Eye Exam   ? ? Visual Acuity (ETDRS)   ? ?   Right Left  ? Dist cc CF at 3' CF at 3'  ? ? Correction: Glasses  ? ?  ?  ? ? Tonometry (Tonopen, 2:18 PM)   ? ?   Right Left  ? Pressure 11 15  ? ?  ?  ? ? Pupils   ? ?   Dark Light Shape React APD  ? Right 5 5 Irregular Minimal None  ? Left 4 3 Round Slow None  ? ?  ?  ? ? Visual Fields   ? ?  Left Right  ?  Full Full  ? ?  ?  ? ? Extraocular Movement   ? ?   Right Left  ?  Full Full  ? ?  ?  ? ? Neuro/Psych   ? ? Oriented x3: Yes  ? Mood/Affect: Normal  ? ?  ?  ? ? Dilation   ? ? Right eye: 1.0% Mydriacyl, 2.5% Phenylephrine @ 2:17 PM  ? ?  ?  ? ?  ? ?Slit Lamp and Fundus Exam   ? ? External Exam   ? ?   Right Left  ? External Normal Normal  ? ?  ?  ? ? Slit Lamp Exam   ? ?   Right Left  ? Lids/Lashes Normal Normal  ? Conjunctiva/Sclera White and quiet White and quiet  ? Cornea Clear Clear  ? Anterior Chamber Shallow temporally with iris to old cataract wound Deep and quiet  ? Iris Posterior synechia to the lens, at 6:00 meridian, with iris defect, and with iris adhesion anterior to the corneal wound starting at 9:00 Round and reactive  ? Lens Centered posterior chamber intraocular lens 3+ Nuclear sclerosis  ? Anterior Vitreous Clear Normal  ? ?  ?  ? ? Fundus Exam   ? ?   Right Left  ? Posterior Vitreous clear, avitric    ? Disc Normal   ? C/D Ratio 0.25   ? Macula less topo distortion   ? Vessels Normal   ? Periphery Normal   ? ?  ?  ? ?  ? ? ?IMAGING AND PROCEDURES  ?Imaging and Procedures for 09/18/21 ? ?OCT, Retina - OU - Both Eyes   ? ?   ?Right Eye ?Quality was good. Scan locations included subfoveal. Central Foveal Thickness: 416. Findings include epiretinal membrane.  ? ?Left Eye ?Quality was borderline. Central Foveal Thickness: 304. Findings include epiretinal membrane.  ? ?Notes ?1 week post vitrectomy membrane peel right eye, with improving macular topography yet still thickened. ? ?  ? ?Color Fundus Photography Optos - OU - Both Eyes   ? ?   ?Right Eye ?Progression has improved. Disc findings include normal observations. Vessels : normal observations. Periphery : normal observations.  ? ?Left Eye ?Progression has no prior data. Disc findings include normal observations. Macula : normal observations. Vessels : normal observations. Periphery : normal observations.  ? ?Notes ?Much less topographic distortion post vitrectomy membrane peel OD ? ?Severe cloudiness and media corresponding to  Cataract OS  ? ? ?  ? ? ?  ?  ? ?  ?ASSESSMENT/PLAN: ? ?Right epiretinal membrane ?Much improved 1 week much less topographic distortion  ? ?  ICD-10-CM   ?1. Right epiretinal membrane  H35.371 OCT, Retina - OU - Both Eyes  ?  Color Fundus Photography Optos - OU - Both Eyes  ?  ? ? ?1.  OD with improving macular anatomy post vitrectomy of severe epiretinal membrane with severe vision loss. ? ?2.  Patient to complete medications topical eyedrops to the right eye only over the next 2 weeks do not refill if completed prior to that time ? ?3.  Patient allowed to return to full activity in 4 days, no compression of the eye, rubbing boxing participation ? ?Ophthalmic Meds Ordered this visit:  ?No orders of the defined types were placed in this encounter. ? ? ?  ? ?Return in about 6 weeks (around 10/30/2021) for dilate, OD, POST OP,  OCT. ? ?Patient Instructions  ?Ofloxacin  4 times daily to the operative eye ? ?Prednisolone acetate 1 drop to the operative eye 4 times daily ? ?Patient instructed not to refill the medications and use them for maximum of 3 weeks. ? ?Patient instructed do not rub the eye.  Patient has the option to use the patch at night.  ? ? ?Explained the diagnoses, plan, and follow up with the patient and they expressed understanding.  Patient expressed understanding of the importance of proper follow up care.  ? ?Alford Highland. Corry Storie M.D. ?Diseases & Surgery of the Retina and Vitreous ?[facility] ?09/18/21 ? ? ? ? ?Abbreviations: ?M myopia (nearsighted); A astigmatism; H hyperopia (farsighted); P presbyopia; Mrx spectacle prescription;  CTL contact lenses; OD right eye; OS left eye; OU both eyes  XT exotropia; ET esotropia; PEK punctate epithelial keratitis; PEE punctate epithelial erosions; DES dry eye syndrome; MGD meibomian gland dysfunction; ATs artificial tears; PFAT's preservative free artificial tears; NSC nuclear sclerotic cataract; PSC posterior subcapsular cataract; ERM epi-retinal membrane; PVD posterior vitreous detachment; RD retinal detachment; DM diabetes mellitus; DR diabetic retinopathy; NPDR non-proliferative diabetic retinopathy; PDR proliferative diabetic retinopathy; CSME clinically significant macular edema; DME diabetic macular edema; dbh dot blot hemorrhages; CWS cotton wool spot; POAG primary open angle glaucoma; C/D cup-to-disc ratio; HVF humphrey visual field; GVF goldmann visual field; OCT optical coherence tomography; IOP intraocular pressure; BRVO Branch retinal vein occlusion; CRVO central retinal vein occlusion; CRAO central retinal artery occlusion; BRAO branch retinal artery occlusion; RT retinal tear; SB scleral buckle; PPV pars plana vitrectomy; VH Vitreous hemorrhage; PRP panretinal laser photocoagulation; IVK intravitreal kenalog; VMT vitreomacular traction; MH Macular hole;   NVD neovascularization of the disc; NVE neovascularization elsewhere; AREDS age related eye disease study; ARMD age related macular degeneration; POAG primary open angle glaucoma; EBMD epithelial/anterior basement membrane

## 2021-09-18 NOTE — Patient Instructions (Signed)
Ofloxacin  4 times daily to the operative eye  Prednisolone acetate 1 drop to the operative eye 4 times daily  Patient instructed not to refill the medications and use them for maximum of 3 weeks.  Patient instructed do not rub the eye.  Patient has the option to use the patch at night. 

## 2021-09-18 NOTE — Assessment & Plan Note (Signed)
Much improved 1 week much less topographic distortion ?

## 2021-10-29 DIAGNOSIS — I890 Lymphedema, not elsewhere classified: Secondary | ICD-10-CM | POA: Diagnosis not present

## 2021-11-03 ENCOUNTER — Encounter (INDEPENDENT_AMBULATORY_CARE_PROVIDER_SITE_OTHER): Payer: Self-pay | Admitting: Ophthalmology

## 2021-11-03 ENCOUNTER — Encounter (INDEPENDENT_AMBULATORY_CARE_PROVIDER_SITE_OTHER): Payer: Medicare PPO | Admitting: Ophthalmology

## 2021-11-03 ENCOUNTER — Ambulatory Visit (INDEPENDENT_AMBULATORY_CARE_PROVIDER_SITE_OTHER): Payer: Medicare PPO | Admitting: Ophthalmology

## 2021-11-03 DIAGNOSIS — Z961 Presence of intraocular lens: Secondary | ICD-10-CM

## 2021-11-03 DIAGNOSIS — H35371 Puckering of macula, right eye: Secondary | ICD-10-CM

## 2021-11-03 DIAGNOSIS — H2512 Age-related nuclear cataract, left eye: Secondary | ICD-10-CM

## 2021-11-03 DIAGNOSIS — H21521 Goniosynechiae, right eye: Secondary | ICD-10-CM

## 2021-11-03 NOTE — Assessment & Plan Note (Signed)
OD now 73-month status post vitrectomy and membrane peel for severe epiretinal membrane and macular topographic distortion. ? ?Looks great improving acuity in the pseudophakic right eye. ?

## 2021-11-03 NOTE — Assessment & Plan Note (Signed)
Stable over time. 

## 2021-11-03 NOTE — Assessment & Plan Note (Signed)
Dense cataract left eye ?

## 2021-11-03 NOTE — Assessment & Plan Note (Signed)
Pseudophakia right with IOL stable, high PAS to the wound OD from previous cataract surgery under the direction of Dr. Baker Pierini ?

## 2021-11-03 NOTE — Progress Notes (Signed)
? ? ?11/03/2021 ? ?  ? ?CHIEF COMPLAINT ?Patient presents for  ?Chief Complaint  ?Patient presents with  ? Post-op Follow-up  ? ? ? ? ?HISTORY OF PRESENT ILLNESS: ?Anita Weiss is a 81 y.o. female who presents to the clinic today for:  ? ?HPI   ? ? Post-op Follow-up   ? ?      ? Laterality: right eye (6 week Epiretinal Membrane)  ? Discomfort: itching (I believe it is allergies.).  Negative for pain, foreign body sensation, tearing and discharge  ? Vision: is worse and is blurred at distance  ? ?  ?  ? ? Comments   ?6 weeks dilate OD, PO, OCT, sx 09/10/2021 Vitrectomy OD. ?Patient reports "It is no better, I could see more clearly before the surgery and that is frustrating. Sometimes, and it is rare, there is something that looks like a '[place]' design logo that is in my vision." ?Patient is not using any prescription eye drops. ? ?  ?  ?Last edited by Laurin Coder on 11/03/2021  1:54 PM.  ?  ? ? ?Referring physician: ?Celene Squibb, MD ?[address] ?[address] ?[place],  [place] [address] ? ?HISTORICAL INFORMATION:  ? ?Selected notes from the [facility] ?  ?   ? ?CURRENT MEDICATIONS: ?No current outpatient medications on file. (Ophthalmic Drugs)  ? ?No current facility-administered medications for this visit. (Ophthalmic Drugs)  ? ?Current Outpatient Medications (Other)  ?Medication Sig  ? atenolol (TENORMIN) 50 MG tablet Take 75 mg by mouth daily.  ? benzonatate (TESSALON) 100 MG capsule Take 1-2 capsules (100-200 mg total) by mouth 3 (three) times daily as needed for cough.  ? cetirizine (ZYRTEC) 5 MG tablet Take 1 tablet (5 mg total) by mouth daily.  ? levothyroxine (SYNTHROID) 25 MCG tablet Take 25 mcg by mouth daily before breakfast.  ? promethazine-dextromethorphan (PROMETHAZINE-DM) 6.25-15 MG/5ML syrup Take 5 mLs by mouth at bedtime as needed for cough.  ? pseudoephedrine (SUDAFED) 30 MG tablet Take 1 tablet (30 mg total) by mouth every 8 (eight) hours as needed for congestion.  ? ?No current  facility-administered medications for this visit. (Other)  ? ? ? ? ?REVIEW OF SYSTEMS: ?ROS   ?Negative for: Constitutional, Gastrointestinal, Neurological, Skin, Genitourinary, Musculoskeletal, HENT, Endocrine, Cardiovascular, Eyes, Respiratory, Psychiatric, Allergic/Imm, Heme/Lymph ?Last edited by Hurman Horn, MD on 11/03/2021  2:44 PM.  ?  ? ? ? ?ALLERGIES ?Allergies  ?Allergen Reactions  ? Codeine   ? ? ?PAST MEDICAL HISTORY ?Past Medical History:  ?Diagnosis Date  ? Hypertension   ? Thyroid disease   ? ?History reviewed. No pertinent surgical history. ? ?FAMILY HISTORY ?History reviewed. No pertinent family history. ? ?SOCIAL HISTORY ?Social History  ? ?Tobacco Use  ? Smoking status: Former  ?  Types: Cigarettes  ? Smokeless tobacco: Never  ?Substance Use Topics  ? Alcohol use: Not Currently  ? Drug use: Never  ? ?  ? ?  ? ?OPHTHALMIC EXAM: ? ?Base Eye Exam   ? ? Visual Acuity (ETDRS)   ? ?   Right Left  ? Dist [place] CF at 3' CF at 3'  ? Dist ph [place] 20/200 -1 20/100 -1  ? ?  ?  ? ? Tonometry (Tonopen, 1:56 PM)   ? ?   Right Left  ? Pressure 7 9  ? ?  ?  ? ? Pupils   ? ?   Dark Light Shape React APD  ? Right  Irregular  None  ? Left 3 3  Minimal None  ? ?  ?  ? ? Extraocular Movement   ? ?   Right Left  ?  Full Full  ? ?  ?  ? ? Neuro/Psych   ? ? Oriented x3: Yes  ? Mood/Affect: Normal  ? ?  ?  ? ? Dilation   ? ? Right eye: 1.0% Mydriacyl, 2.5% Phenylephrine @ 1:56 PM  ? ?  ?  ? ?  ? ?Slit Lamp and Fundus Exam   ? ? External Exam   ? ?   Right Left  ? External Normal Normal  ? ?  ?  ? ? Slit Lamp Exam   ? ?   Right Left  ? Lids/Lashes Normal Normal  ? Conjunctiva/Sclera White and quiet White and quiet  ? Cornea Clear Clear  ? Anterior Chamber Shallow temporally with iris to old cataract wound Deep and quiet  ? Iris Posterior synechia to the lens, at 6:00 meridian, with iris defect, and with iris adhesion anterior to the corneal wound starting at 9:00 Round and reactive  ? Lens Centered posterior chamber  intraocular lens 3+ Nuclear sclerosis  ? Anterior Vitreous Clear Normal  ? ?  ?  ? ? Fundus Exam   ? ?   Right Left  ? Posterior Vitreous clear, avitric   ? Disc Normal   ? C/D Ratio 0.25   ? Macula less topo distortion   ? Vessels Normal   ? Periphery Normal   ? ?  ?  ? ?  ? ? ?IMAGING AND PROCEDURES  ?Imaging and Procedures for 11/03/21 ? ?OCT, Retina - OU - Both Eyes   ? ?   ?Right Eye ?Quality was good. Scan locations included subfoveal. Central Foveal Thickness: 387. Progression has improved. Findings include epiretinal membrane.  ? ?Left Eye ?Quality was borderline. Scan locations included subfoveal. Central Foveal Thickness: 324. Findings include epiretinal membrane, normal foveal contour.  ? ?Notes ?7 week post vitrectomy membrane peel right eye, with improving macular topography yet still thickened. ? ?OS some medial opacity ? ?  ? ? ?  ?  ? ?  ?ASSESSMENT/PLAN: ? ?Right epiretinal membrane ?OD now 47-month status post vitrectomy and membrane peel for severe epiretinal membrane and macular topographic distortion. ? ?Looks great improving acuity in the pseudophakic right eye. ? ?Pseudophakia of right eye ?Pseudophakia right with IOL stable, high PAS to the wound OD from previous cataract surgery under the direction of Dr. Quentin Ore ? ?Iris peripheral anterior synechiae, right ?Stable over time ? ?Nuclear sclerotic cataract of left eye ?Dense cataract left eye  ? ?  ICD-10-CM   ?1. Right epiretinal membrane  H35.371 OCT, Retina - OU - Both Eyes  ?  ?2. Pseudophakia of right eye  Z96.1   ?  ?3. Iris peripheral anterior synechiae, right  H21.521   ?  ?4. Nuclear sclerotic cataract of left eye  H25.12   ?  ? ? ?1.  OD looks great post vitrectomy membrane peel for severe epiretinal membrane ? ?2.  OS with dense cataract, needs cataract surgery with intraocular lens implantation.  Patient wants me to arrange evaluation with [facility]. ? ?3. ? ?Ophthalmic Meds Ordered this visit:  ?No orders of the defined  types were placed in this encounter. ? ? ?  ? ?Return in about 8 weeks (around 12/29/2021) for DILATE OU, OCT. ? ?There are no Patient Instructions on file for this visit. ? ? ?  Explained the diagnoses, plan, and follow up with the patient and they expressed understanding.  Patient expressed understanding of the importance of proper follow up care.  ? ?Clent Demark. Nitish Roes M.D. ?Diseases & Surgery of the Retina and Vitreous ?[facility] ?11/03/21 ? ? ? ? ?Abbreviations: ?M myopia (nearsighted); A astigmatism; H hyperopia (farsighted); P presbyopia; Mrx spectacle prescription;  CTL contact lenses; OD right eye; OS left eye; OU both eyes  XT exotropia; ET esotropia; PEK punctate epithelial keratitis; PEE punctate epithelial erosions; DES dry eye syndrome; MGD meibomian gland dysfunction; ATs artificial tears; PFAT's preservative free artificial tears; [place] nuclear sclerotic cataract; PSC posterior subcapsular cataract; ERM epi-retinal membrane; PVD posterior vitreous detachment; RD retinal detachment; DM diabetes mellitus; DR diabetic retinopathy; NPDR non-proliferative diabetic retinopathy; PDR proliferative diabetic retinopathy; CSME clinically significant macular edema; DME diabetic macular edema; dbh dot blot hemorrhages; CWS cotton wool spot; POAG primary open angle glaucoma; C/D cup-to-disc ratio; HVF humphrey visual field; GVF goldmann visual field; OCT optical coherence tomography; IOP intraocular pressure; BRVO Branch retinal vein occlusion; CRVO central retinal vein occlusion; CRAO central retinal artery occlusion; BRAO branch retinal artery occlusion; RT retinal tear; SB scleral buckle; PPV pars plana vitrectomy; VH Vitreous hemorrhage; PRP panretinal laser photocoagulation; IVK intravitreal kenalog; VMT vitreomacular traction; MH Macular hole;  NVD neovascularization of the disc; NVE neovascularization elsewhere; AREDS age related eye disease study; ARMD age related macular degeneration; POAG  primary open angle glaucoma; EBMD epithelial/anterior basement membrane dystrophy; ACIOL anterior chamber intraocular lens; IOL intraocular lens; PCIOL posterior chamber intraocular lens; Phaco/IOL phacoemulsification with int

## 2021-12-11 DIAGNOSIS — Z961 Presence of intraocular lens: Secondary | ICD-10-CM | POA: Diagnosis not present

## 2021-12-11 DIAGNOSIS — H25812 Combined forms of age-related cataract, left eye: Secondary | ICD-10-CM | POA: Diagnosis not present

## 2021-12-11 DIAGNOSIS — Z9889 Other specified postprocedural states: Secondary | ICD-10-CM | POA: Diagnosis not present

## 2021-12-29 ENCOUNTER — Encounter (INDEPENDENT_AMBULATORY_CARE_PROVIDER_SITE_OTHER): Payer: Medicare PPO | Admitting: Ophthalmology

## 2021-12-30 DIAGNOSIS — E559 Vitamin D deficiency, unspecified: Secondary | ICD-10-CM | POA: Diagnosis not present

## 2021-12-30 DIAGNOSIS — I10 Essential (primary) hypertension: Secondary | ICD-10-CM | POA: Diagnosis not present

## 2022-01-06 DIAGNOSIS — N182 Chronic kidney disease, stage 2 (mild): Secondary | ICD-10-CM | POA: Diagnosis not present

## 2022-01-06 DIAGNOSIS — R809 Proteinuria, unspecified: Secondary | ICD-10-CM | POA: Diagnosis not present

## 2022-01-06 DIAGNOSIS — J302 Other seasonal allergic rhinitis: Secondary | ICD-10-CM | POA: Diagnosis not present

## 2022-01-06 DIAGNOSIS — E782 Mixed hyperlipidemia: Secondary | ICD-10-CM | POA: Diagnosis not present

## 2022-01-06 DIAGNOSIS — E559 Vitamin D deficiency, unspecified: Secondary | ICD-10-CM | POA: Diagnosis not present

## 2022-01-06 DIAGNOSIS — I10 Essential (primary) hypertension: Secondary | ICD-10-CM | POA: Diagnosis not present

## 2022-01-06 DIAGNOSIS — L209 Atopic dermatitis, unspecified: Secondary | ICD-10-CM | POA: Diagnosis not present

## 2022-01-06 DIAGNOSIS — F5109 Other insomnia not due to a substance or known physiological condition: Secondary | ICD-10-CM | POA: Diagnosis not present

## 2022-01-06 DIAGNOSIS — E039 Hypothyroidism, unspecified: Secondary | ICD-10-CM | POA: Diagnosis not present

## 2022-02-04 ENCOUNTER — Encounter (INDEPENDENT_AMBULATORY_CARE_PROVIDER_SITE_OTHER): Payer: Medicare PPO | Admitting: Ophthalmology

## 2022-02-04 ENCOUNTER — Ambulatory Visit (INDEPENDENT_AMBULATORY_CARE_PROVIDER_SITE_OTHER): Payer: Medicare PPO | Admitting: Ophthalmology

## 2022-02-04 DIAGNOSIS — H2512 Age-related nuclear cataract, left eye: Secondary | ICD-10-CM

## 2022-02-04 DIAGNOSIS — H35371 Puckering of macula, right eye: Secondary | ICD-10-CM

## 2022-02-04 DIAGNOSIS — H35372 Puckering of macula, left eye: Secondary | ICD-10-CM

## 2022-02-04 DIAGNOSIS — H35373 Puckering of macula, bilateral: Secondary | ICD-10-CM | POA: Diagnosis not present

## 2022-02-04 NOTE — Progress Notes (Signed)
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 female who presents to the clinic today for:   HPI   8 weeks dilate ou oct Pt states her vision has not been stable Pt denies any new floaters or FOL  Last edited by Anita Weiss, CMA on 02/04/2022  2:17 PM.      Referring physician: Olivia Canter, MD [address] [name],  [place] [address]  HISTORICAL INFORMATION:   Selected notes from the MEDICAL RECORD NUMBER       CURRENT MEDICATIONS: No current outpatient medications on file. (Ophthalmic Drugs)   No current facility-administered medications for this visit. (Ophthalmic Drugs)   Current Outpatient Medications (Other)  Medication Sig   atenolol (TENORMIN) 50 MG tablet Take 75 mg by mouth daily.   benzonatate (TESSALON) 100 MG capsule Take 1-2 capsules (100-200 mg total) by mouth 3 (three) times daily as needed for cough.   cetirizine (ZYRTEC) 5 MG tablet Take 1 tablet (5 mg total) by mouth daily.   levothyroxine (SYNTHROID) 25 MCG tablet Take 25 mcg by mouth daily before breakfast.   promethazine-dextromethorphan (PROMETHAZINE-DM) 6.25-15 MG/5ML syrup Take 5 mLs by mouth at bedtime as needed for cough.   pseudoephedrine (SUDAFED) 30 MG tablet Take 1 tablet (30 mg total) by mouth every 8 (eight) hours as needed for congestion.   No current facility-administered medications for this visit. (Other)      REVIEW OF SYSTEMS: ROS   Negative for: Constitutional, Gastrointestinal, Neurological, Skin, Genitourinary, Musculoskeletal, HENT, Endocrine, Cardiovascular, Eyes, Respiratory, Psychiatric, Allergic/Imm, Heme/Lymph Last edited by Anita Weiss, CMA on 02/04/2022  2:17 PM.       ALLERGIES Allergies  Allergen Reactions   Codeine     PAST MEDICAL HISTORY Past Medical  History:  Diagnosis Date   Hypertension    Thyroid disease    No past surgical history on file.  FAMILY HISTORY No family history on file.  SOCIAL HISTORY Social History   Tobacco Use   Smoking status: Former    Types: Cigarettes   Smokeless tobacco: Never  Substance Use Topics   Alcohol use: Not Currently   Drug use: Never         OPHTHALMIC EXAM:  Base Eye Exam     Visual Acuity (Snellen - Linear)       Right Left   Dist [place] 20/200 20/250         Tonometry (Tonopen, 2:23 PM)       Right Left   Pressure 8 13         Extraocular Movement       Right Left    Ortho Ortho    -- -- --  --  --  -- -- --   -- -- --  --  --  -- -- --           Neuro/Psych     Oriented x3: Yes   Mood/Affect: Normal         Dilation     Both eyes: 1.0% Mydriacyl, 2.5% Phenylephrine @ 2:20 PM           Slit Lamp and Fundus Exam     Slit Lamp Exam       Right Left   Lids/Lashes Normal Normal   Conjunctiva/Sclera White  and quiet White and quiet   Cornea Clear Clear   Anterior Chamber Shallow temporally with iris to old cataract wound Deep and quiet   Iris Posterior synechia to the lens, at 6:00 meridian, with iris defect, and with iris adhesion anterior to the corneal wound starting at 9:00 Round and reactive   Lens Centered posterior chamber intraocular lens 3+ Nuclear sclerosis   Anterior Vitreous Clear Normal         Fundus Exam       Right Left   Posterior Vitreous Normal Normal   Disc Normal Normal   C/D Ratio 0.25 0.3   Macula Visual topographic distortion Poor detail due to cataract   Vessels Normal Normal   Periphery Normal Normal            IMAGING AND PROCEDURES  Imaging and Procedures for 02/04/22  OCT, Retina - OU - Both Eyes       Right Eye Quality was good. Scan locations included subfoveal. Central Foveal Thickness: 387. Progression has improved.   Notes OS no views due to dense cataract.  History of no thickening  but also perifoveal stasis type changes in the macula  OD, much improved topographic distortion.  Details of the fovea coming into view now post vitrectomy for very severe epiretinal membrane performance 5 to 6 months previous.  Likely to continue to improve.              ASSESSMENT/PLAN:  Nuclear sclerotic cataract of left eye OS, dense cataract, requires cataract degree and IOL implantation.  Left epiretinal membrane Not well seen today through undilated pupil with OCT however present in the past nonetheless needs cataract surgery in left eye to maximize visual potential and ability to monitor the macular condition of the left eye.  Right epiretinal membrane Now some 5 months post vitrectomy membrane peel macular anatomy vastly improved.  Acuity still has a chance to continue improve     ICD-10-CM   1. Right epiretinal membrane  H35.371 OCT, Retina - OU - Both Eyes    2. Nuclear sclerotic cataract of left eye  H25.12     3. Left epiretinal membrane  H35.372       1.  OD will continue to observe  2.  OS follow-up.  Post cataract surgery with Dr. Fabian Weiss evaluate macula  3.  Ophthalmic Meds Ordered this visit:  No orders of the defined types were placed in this encounter.      Return in about 6 months (around 08/07/2022) for DILATE OU, OCT.  There are no Patient Instructions on file for this visit.   Explained the diagnoses, plan, and follow up with the patient and they expressed understanding.  Patient expressed understanding of the importance of proper follow up care.   Anita Weiss M.D. Diseases & Surgery of the Retina and Vitreous [facility] 02/04/22     Abbreviations: M myopia (nearsighted); A astigmatism; H hyperopia (farsighted); P presbyopia; Mrx spectacle prescription;  CTL contact lenses; OD right eye; OS left eye; OU both eyes  XT exotropia; ET esotropia; PEK punctate epithelial keratitis; PEE punctate epithelial  erosions; DES dry eye syndrome; MGD meibomian gland dysfunction; ATs artificial tears; PFAT's preservative free artificial tears; NSC nuclear sclerotic cataract; PSC posterior subcapsular cataract; ERM epi-retinal membrane; PVD posterior vitreous detachment; RD retinal detachment; DM diabetes mellitus; DR diabetic retinopathy; NPDR non-proliferative diabetic retinopathy; PDR proliferative diabetic retinopathy; CSME clinically significant macular edema; DME diabetic macular edema; dbh dot  blot hemorrhages; CWS cotton wool spot; POAG primary open angle glaucoma; C/D cup-to-disc ratio; HVF humphrey visual field; GVF goldmann visual field; OCT optical coherence tomography; IOP intraocular pressure; BRVO Branch retinal vein occlusion; CRVO central retinal vein occlusion; CRAO central retinal artery occlusion; BRAO branch retinal artery occlusion; RT retinal tear; SB scleral buckle; PPV pars plana vitrectomy; VH Vitreous hemorrhage; PRP panretinal laser photocoagulation; IVK intravitreal kenalog; VMT vitreomacular traction; MH Macular hole;  NVD neovascularization of the disc; NVE neovascularization elsewhere; AREDS age related eye disease study; ARMD age related macular degeneration; POAG primary open angle glaucoma; EBMD epithelial/anterior basement membrane dystrophy; ACIOL anterior chamber intraocular lens; IOL intraocular lens; PCIOL posterior chamber intraocular lens; Phaco/IOL phacoemulsification with intraocular lens placement; [place] photorefractive keratectomy; LASIK laser assisted in situ keratomileusis; HTN hypertension; DM diabetes mellitus; COPD chronic obstructive pulmonary disease

## 2022-02-04 NOTE — Assessment & Plan Note (Signed)
OS, dense cataract, requires cataract degree and IOL implantation.

## 2022-02-04 NOTE — Assessment & Plan Note (Signed)
Now some 5 months post vitrectomy membrane peel macular anatomy vastly improved.  Acuity still has a chance to continue improve

## 2022-02-04 NOTE — Assessment & Plan Note (Signed)
Not well seen today through undilated pupil with OCT however present in the past nonetheless needs cataract surgery in left eye to maximize visual potential and ability to monitor the macular condition of the left eye.

## 2022-02-09 ENCOUNTER — Encounter (INDEPENDENT_AMBULATORY_CARE_PROVIDER_SITE_OTHER): Payer: Medicare PPO | Admitting: Ophthalmology

## 2022-02-24 DIAGNOSIS — H25812 Combined forms of age-related cataract, left eye: Secondary | ICD-10-CM | POA: Diagnosis not present

## 2022-05-12 ENCOUNTER — Other Ambulatory Visit: Payer: Self-pay

## 2022-05-12 ENCOUNTER — Encounter (HOSPITAL_COMMUNITY): Payer: Self-pay

## 2022-05-12 ENCOUNTER — Emergency Department (HOSPITAL_COMMUNITY)
Admission: EM | Admit: 2022-05-12 | Discharge: 2022-05-13 | Disposition: A | Discharge status: Home / Self Care | Payer: Medicare PPO | Attending: Emergency Medicine | Admitting: Emergency Medicine

## 2022-05-12 DIAGNOSIS — S81819A Laceration without foreign body, unspecified lower leg, initial encounter: Secondary | ICD-10-CM

## 2022-05-12 DIAGNOSIS — S81811A Laceration without foreign body, right lower leg, initial encounter: Secondary | ICD-10-CM | POA: Diagnosis not present

## 2022-05-12 DIAGNOSIS — S81809A Unspecified open wound, unspecified lower leg, initial encounter: Secondary | ICD-10-CM | POA: Diagnosis not present

## 2022-05-12 DIAGNOSIS — Y93E5 Activity, floor mopping and cleaning: Secondary | ICD-10-CM | POA: Diagnosis not present

## 2022-05-12 DIAGNOSIS — I10 Essential (primary) hypertension: Secondary | ICD-10-CM | POA: Diagnosis not present

## 2022-05-12 DIAGNOSIS — S8991XA Unspecified injury of right lower leg, initial encounter: Secondary | ICD-10-CM | POA: Diagnosis present

## 2022-05-12 DIAGNOSIS — Z87891 Personal history of nicotine dependence: Secondary | ICD-10-CM | POA: Diagnosis not present

## 2022-05-12 DIAGNOSIS — W268XXA Contact with other sharp object(s), not elsewhere classified, initial encounter: Secondary | ICD-10-CM | POA: Diagnosis not present

## 2022-05-12 MED ORDER — LIDOCAINE HCL (PF) 1 % IJ SOLN
INTRAMUSCULAR | Status: AC
  Filled 2022-05-12: qty 30

## 2022-05-12 MED ORDER — LIDOCAINE HCL (PF) 1 % IJ SOLN
30.0000 mL | Freq: Once | INTRAMUSCULAR | Status: AC
  Administered 2022-05-13: 30 mL

## 2022-05-12 NOTE — ED Provider Notes (Incomplete)
[facility]-EMERGENCY DEPT [facility] Emergency Department Provider Note MRN:  [id number]  Arrival date & time: 05/12/22     Chief Complaint   Laceration   History of Present Illness   Anita Weiss is a 81 y.o. year-old female with a history of hypertension presenting to the ED with chief complaint of laceration.  Patient dropped her Waterpik on her leg this morning causing a laceration.  Sent here from urgent care for repair.  Denies any other injuries.  Review of Systems  A thorough review of systems was obtained and all systems are negative except as noted in the HPI and PMH.   Patient's Health History    Past Medical History:  Diagnosis Date  . Hypertension   . Thyroid disease     History reviewed. No pertinent surgical history.  History reviewed. No pertinent family history.  Social History   Socioeconomic History  . Marital status: Divorced    Spouse name: Not on file  . Number of children: Not on file  . Years of education: Not on file  . Highest education level: Not on file  Occupational History  . Not on file  Tobacco Use  . Smoking status: Former    Types: Cigarettes  . Smokeless tobacco: Never  Substance and Sexual Activity  . Alcohol use: Not Currently  . Drug use: Never  . Sexual activity: Not on file  Other Topics Concern  . Not on file  Social History Narrative  . Not on file   Social Determinants of Health   Financial Resource Strain: Not on file  Food Insecurity: Not on file  Transportation Needs: Not on file  Physical Activity: Not on file  Stress: Not on file  Social Connections: Not on file  Intimate Partner Violence: Not on file     Physical Exam   Vitals:   05/12/22 1918  BP: (!) 210/64  Pulse: 74  Resp: 20  Temp: 98.2 F (36.8 C)  SpO2: 100%    CONSTITUTIONAL: Well-appearing, NAD NEURO/PSYCH:  Alert and oriented x 3, no focal deficits EYES:  eyes equal and reactive ENT/NECK:  no LAD, no JVD CARDIO: Regular rate,  well-perfused, normal S1 and S2 PULM:  CTAB no wheezing or rhonchi GI/GU:  non-distended, non-tender MSK/SPINE:  No gross deformities, no edema SKIN: Linear laceration to the right lateral lower leg   *Additional and/or pertinent findings included in MDM below  Diagnostic and Interventional Summary    EKG Interpretation  Date/Time:    Ventricular Rate:    PR Interval:    QRS Duration:   QT Interval:    QTC Calculation:   R Axis:     Text Interpretation:         Labs Reviewed - No data to display  No orders to display    Medications  lidocaine (PF) (XYLOCAINE) 1 % injection 30 mL (has no administration in time range)     Procedures  /  Critical Care Procedures  ED Course and Medical Decision Making  Initial Impression and Ddx Will need lack repair.  Patient denies any chance of foreign body, declines tetanus shot at this time.  Will discuss pros and cons of prophylactic antibiotics.  Past medical/surgical history that increases complexity of ED encounter:  ***  Interpretation of Diagnostics I personally reviewed the {BEROINTERP:26835} and my interpretation is as follows:  ***  ***  Patient Reassessment and Ultimate Disposition/Management     ***  Patient management required discussion with the following services  or consulting groups:  {BEROCONSULT:26841}  Complexity of Problems Addressed {BEROCOPA:26833}  Additional Data Reviewed and Analyzed Further history obtained from: {BERODATA:26834}  Additional Factors Impacting ED Encounter Risk {BERORISK:26838}  Elmer Sow. Pilar Plate, MD [facility] Emergency [facility] [email or web address]  Final Clinical Impressions(s) / ED Diagnoses     ICD-10-CM   1. Laceration of lower extremity, unspecified laterality, initial encounter  S81.819A       ED Discharge Orders     None        Discharge Instructions Discussed with and Provided to Patient:   Discharge Instructions   None

## 2022-05-12 NOTE — ED Notes (Signed)
Suture cart at bedside.  ED provider at bedside.

## 2022-05-12 NOTE — ED Triage Notes (Signed)
POV from home. Cc of laceration  to the inside of her right ankle. Said she dropped a waterpik this morning while cleaning her bathroom and it cut her leg. Went to [facility] and was told they can't suture it.

## 2022-05-12 NOTE — ED Provider Notes (Signed)
AP-EMERGENCY DEPT [facility] Emergency Department Provider Note MRN:  [id number]  Arrival date & time: 05/13/22     Chief Complaint   Laceration   History of Present Illness   Anita Weiss is a 81 y.o. year-old female with a history of hypertension presenting to the ED with chief complaint of laceration.  Patient dropped her Waterpik on her leg this morning causing a laceration.  Sent here from urgent care for repair.  Denies any other injuries.  Review of Systems  A thorough review of systems was obtained and all systems are negative except as noted in the HPI and PMH.   Patient's Health History    Past Medical History:  Diagnosis Date   Hypertension    Thyroid disease     History reviewed. No pertinent surgical history.  History reviewed. No pertinent family history.  Social History   Socioeconomic History   Marital status: Divorced    Spouse name: Not on file   Number of children: Not on file   Years of education: Not on file   Highest education level: Not on file  Occupational History   Not on file  Tobacco Use   Smoking status: Former    Types: Cigarettes   Smokeless tobacco: Never  Substance and Sexual Activity   Alcohol use: Not Currently   Drug use: Never   Sexual activity: Not on file  Other Topics Concern   Not on file  Social History Narrative   Not on file   Social Determinants of Health   Financial Resource Strain: Not on file  Food Insecurity: Not on file  Transportation Needs: Not on file  Physical Activity: Not on file  Stress: Not on file  Social Connections: Not on file  Intimate Partner Violence: Not on file     Physical Exam   Vitals:   05/12/22 1918 05/13/22 0017  BP: (!) 210/64 (!) 200/77  Pulse: 74 85  Resp: 20 17  Temp: 98.2 F (36.8 C)   SpO2: 100% 99%    CONSTITUTIONAL: Well-appearing, NAD NEURO/PSYCH:  Alert and oriented x 3, no focal deficits EYES:  eyes equal and reactive ENT/NECK:  no LAD, no  JVD CARDIO: Regular rate, well-perfused, normal S1 and S2 PULM:  CTAB no wheezing or rhonchi GI/GU:  non-distended, non-tender MSK/SPINE:  No gross deformities, no edema SKIN: Linear laceration to the right lateral lower leg   *Additional and/or pertinent findings included in MDM below  Diagnostic and Interventional Summary    EKG Interpretation  Date/Time:    Ventricular Rate:    PR Interval:    QRS Duration:   QT Interval:    QTC Calculation:   R Axis:     Text Interpretation:         Labs Reviewed - No data to display  No orders to display    Medications  lidocaine (PF) (XYLOCAINE) 1 % injection 30 mL (30 mLs Infiltration Given by Other 05/13/22 0018)     Procedures  /  Critical Care .Marland KitchenLaceration Repair  Date/Time: 05/13/2022 12:27 AM  Performed by: Sabas Sous, MD Authorized by: Sabas Sous, MD   Consent:    Consent obtained:  Verbal   Consent given by:  Patient   Risks, benefits, and alternatives were discussed: yes     Risks discussed:  Infection, need for additional repair, nerve damage, poor wound healing, poor cosmetic result, pain, retained foreign body, tendon damage and vascular damage   Alternatives discussed:  No  treatment Universal protocol:    Procedure explained and questions answered to patient or proxy's satisfaction: yes     Immediately prior to procedure, a time out was called: yes     Patient identity confirmed:  Verbally with patient Anesthesia:    Anesthesia method:  Local infiltration   Local anesthetic:  Lidocaine 1% w/o epi Laceration details:    Location:  Leg   Leg location:  R lower leg   Length (cm):  3.5   Depth (mm):  3 Pre-procedure details:    Preparation:  Patient was prepped and draped in usual sterile fashion Exploration:    Limited defect created (wound extended): no     Hemostasis achieved with:  Direct pressure   Wound exploration: wound explored through full range of motion and entire depth of wound  visualized     Contaminated: no   Treatment:    Area cleansed with:  Saline   Amount of cleaning:  Extensive   Debridement:  None   Undermining:  Minimal   Layers/structures repaired:  Deep subcutaneous Deep subcutaneous:    Suture size:  5-0   Suture material:  Vicryl   Suture technique:  Simple interrupted   Number of sutures:  1 Skin repair:    Repair method:  Sutures   Suture size:  4-0   Suture material:  Prolene   Suture technique:  Simple interrupted   Number of sutures:  6 Approximation:    Approximation:  Close Repair type:    Repair type:  Intermediate Post-procedure details:    Dressing:  Adhesive bandage   Procedure completion:  Tolerated well, no immediate complications Comments:     Signs of venous bleeding after wound was anesthetized and during cleaning with normal saline.  Single interrupted stitch to the subcutaneous tissue with significant improvement in bleeding, hemostasis largely achieved.  Skin then closed.   ED Course and Medical Decision Making  Initial Impression and Ddx Will need lack repair.  Patient denies any chance of foreign body, declines tetanus shot at this time.  Will discuss pros and cons of prophylactic antibiotics.  Past medical/surgical history that increases complexity of ED encounter: None  Interpretation of Diagnostics Laboratory and/or imaging options to aid in the diagnosis/care of the patient were considered.  After careful history and physical examination, it was determined that there was no indication for diagnostics at this time.  Patient Reassessment and Ultimate Disposition/Management     Discharge  Patient management required discussion with the following services or consulting groups:  None  Complexity of Problems Addressed Acute complicated illness or Injury  Additional Data Reviewed and Analyzed Further history obtained from: None  Additional Factors Impacting ED Encounter Risk Prescriptions and Minor  Procedures  Barth Kirks. Sedonia Small, MD [place] [email or web address]  Final Clinical Impressions(s) / ED Diagnoses     ICD-10-CM   1. Laceration of lower extremity, unspecified laterality, initial encounter  S81.819A       ED Discharge Orders          Ordered    cephALEXin (KEFLEX) 500 MG capsule  3 times daily        05/13/22 0030             Discharge Instructions Discussed with and Provided to Patient:     Discharge Instructions      You were evaluated in the Emergency Department and after careful evaluation, we did not find any emergent condition requiring admission or further  testing in the hospital.  Your exam/testing today was overall reassuring.  We repaired your laceration here in the emergency department.  These stitches will need to be removed in 7 to 10 days by healthcare professional.  Recommend taking the Keflex antibiotic as directed to prevent infection.  Please return to the Emergency Department if you experience any worsening of your condition.  Thank you for allowing [place] to be a part of your care.        Maudie Flakes, MD 05/13/22 [phone]

## 2022-05-13 MED ORDER — CEPHALEXIN 500 MG PO CAPS: 500.0000 mg | ORAL_CAPSULE | Freq: Three times a day (TID) | ORAL | 0 refills | Status: AC

## 2022-05-13 NOTE — Discharge Instructions (Addendum)
You were evaluated in the Emergency Department and after careful evaluation, we did not find any emergent condition requiring admission or further testing in the hospital.  Your exam/testing today was overall reassuring.  We repaired your laceration here in the emergency department.  These stitches will need to be removed in 7 to 10 days by healthcare professional.  Recommend taking the Keflex antibiotic as directed to prevent infection.  Please return to the Emergency Department if you experience any worsening of your condition.  Thank you for allowing [place] to be a part of your care.

## 2022-05-13 NOTE — ED Notes (Signed)
Pressure dressing applied to right ankle.

## 2022-05-20 DIAGNOSIS — B999 Unspecified infectious disease: Secondary | ICD-10-CM | POA: Diagnosis not present

## 2022-05-20 DIAGNOSIS — Z4802 Encounter for removal of sutures: Secondary | ICD-10-CM | POA: Diagnosis not present

## 2022-05-28 DIAGNOSIS — M9901 Segmental and somatic dysfunction of cervical region: Secondary | ICD-10-CM | POA: Diagnosis not present

## 2022-05-28 DIAGNOSIS — M546 Pain in thoracic spine: Secondary | ICD-10-CM | POA: Diagnosis not present

## 2022-05-28 DIAGNOSIS — M9902 Segmental and somatic dysfunction of thoracic region: Secondary | ICD-10-CM | POA: Diagnosis not present

## 2022-05-28 DIAGNOSIS — M531 Cervicobrachial syndrome: Secondary | ICD-10-CM | POA: Diagnosis not present

## 2022-05-28 DIAGNOSIS — M47812 Spondylosis without myelopathy or radiculopathy, cervical region: Secondary | ICD-10-CM | POA: Diagnosis not present

## 2022-06-01 DIAGNOSIS — M546 Pain in thoracic spine: Secondary | ICD-10-CM | POA: Diagnosis not present

## 2022-06-01 DIAGNOSIS — M9901 Segmental and somatic dysfunction of cervical region: Secondary | ICD-10-CM | POA: Diagnosis not present

## 2022-06-01 DIAGNOSIS — M531 Cervicobrachial syndrome: Secondary | ICD-10-CM | POA: Diagnosis not present

## 2022-06-01 DIAGNOSIS — M9902 Segmental and somatic dysfunction of thoracic region: Secondary | ICD-10-CM | POA: Diagnosis not present

## 2022-06-01 DIAGNOSIS — M47812 Spondylosis without myelopathy or radiculopathy, cervical region: Secondary | ICD-10-CM | POA: Diagnosis not present

## 2022-06-08 DIAGNOSIS — M47812 Spondylosis without myelopathy or radiculopathy, cervical region: Secondary | ICD-10-CM | POA: Diagnosis not present

## 2022-06-08 DIAGNOSIS — M546 Pain in thoracic spine: Secondary | ICD-10-CM | POA: Diagnosis not present

## 2022-06-08 DIAGNOSIS — M9901 Segmental and somatic dysfunction of cervical region: Secondary | ICD-10-CM | POA: Diagnosis not present

## 2022-06-08 DIAGNOSIS — M9902 Segmental and somatic dysfunction of thoracic region: Secondary | ICD-10-CM | POA: Diagnosis not present

## 2022-06-08 DIAGNOSIS — M531 Cervicobrachial syndrome: Secondary | ICD-10-CM | POA: Diagnosis not present

## 2022-06-18 DIAGNOSIS — M9902 Segmental and somatic dysfunction of thoracic region: Secondary | ICD-10-CM | POA: Diagnosis not present

## 2022-06-18 DIAGNOSIS — M9901 Segmental and somatic dysfunction of cervical region: Secondary | ICD-10-CM | POA: Diagnosis not present

## 2022-06-18 DIAGNOSIS — M47812 Spondylosis without myelopathy or radiculopathy, cervical region: Secondary | ICD-10-CM | POA: Diagnosis not present

## 2022-06-18 DIAGNOSIS — M546 Pain in thoracic spine: Secondary | ICD-10-CM | POA: Diagnosis not present

## 2022-06-18 DIAGNOSIS — M531 Cervicobrachial syndrome: Secondary | ICD-10-CM | POA: Diagnosis not present

## 2022-07-02 DIAGNOSIS — M9901 Segmental and somatic dysfunction of cervical region: Secondary | ICD-10-CM | POA: Diagnosis not present

## 2022-07-02 DIAGNOSIS — M47812 Spondylosis without myelopathy or radiculopathy, cervical region: Secondary | ICD-10-CM | POA: Diagnosis not present

## 2022-07-02 DIAGNOSIS — M9902 Segmental and somatic dysfunction of thoracic region: Secondary | ICD-10-CM | POA: Diagnosis not present

## 2022-07-02 DIAGNOSIS — M546 Pain in thoracic spine: Secondary | ICD-10-CM | POA: Diagnosis not present

## 2022-07-02 DIAGNOSIS — M531 Cervicobrachial syndrome: Secondary | ICD-10-CM | POA: Diagnosis not present

## 2022-07-03 DIAGNOSIS — E039 Hypothyroidism, unspecified: Secondary | ICD-10-CM | POA: Diagnosis not present

## 2022-07-03 DIAGNOSIS — R809 Proteinuria, unspecified: Secondary | ICD-10-CM | POA: Diagnosis not present

## 2022-07-03 DIAGNOSIS — I10 Essential (primary) hypertension: Secondary | ICD-10-CM | POA: Diagnosis not present

## 2022-07-09 DIAGNOSIS — I10 Essential (primary) hypertension: Secondary | ICD-10-CM | POA: Diagnosis not present

## 2022-07-09 DIAGNOSIS — L209 Atopic dermatitis, unspecified: Secondary | ICD-10-CM | POA: Diagnosis not present

## 2022-07-09 DIAGNOSIS — G4700 Insomnia, unspecified: Secondary | ICD-10-CM | POA: Diagnosis not present

## 2022-07-09 DIAGNOSIS — E782 Mixed hyperlipidemia: Secondary | ICD-10-CM | POA: Diagnosis not present

## 2022-07-09 DIAGNOSIS — I129 Hypertensive chronic kidney disease with stage 1 through stage 4 chronic kidney disease, or unspecified chronic kidney disease: Secondary | ICD-10-CM | POA: Diagnosis not present

## 2022-07-09 DIAGNOSIS — E039 Hypothyroidism, unspecified: Secondary | ICD-10-CM | POA: Diagnosis not present

## 2022-07-09 DIAGNOSIS — N182 Chronic kidney disease, stage 2 (mild): Secondary | ICD-10-CM | POA: Diagnosis not present

## 2022-07-09 DIAGNOSIS — Z0000 Encounter for general adult medical examination without abnormal findings: Secondary | ICD-10-CM | POA: Diagnosis not present

## 2022-07-09 DIAGNOSIS — E559 Vitamin D deficiency, unspecified: Secondary | ICD-10-CM | POA: Diagnosis not present

## 2022-07-16 DIAGNOSIS — M531 Cervicobrachial syndrome: Secondary | ICD-10-CM | POA: Diagnosis not present

## 2022-07-16 DIAGNOSIS — M47812 Spondylosis without myelopathy or radiculopathy, cervical region: Secondary | ICD-10-CM | POA: Diagnosis not present

## 2022-07-16 DIAGNOSIS — M9901 Segmental and somatic dysfunction of cervical region: Secondary | ICD-10-CM | POA: Diagnosis not present

## 2022-07-16 DIAGNOSIS — M546 Pain in thoracic spine: Secondary | ICD-10-CM | POA: Diagnosis not present

## 2022-07-16 DIAGNOSIS — M9902 Segmental and somatic dysfunction of thoracic region: Secondary | ICD-10-CM | POA: Diagnosis not present

## 2022-08-06 DIAGNOSIS — M47812 Spondylosis without myelopathy or radiculopathy, cervical region: Secondary | ICD-10-CM | POA: Diagnosis not present

## 2022-08-06 DIAGNOSIS — M9902 Segmental and somatic dysfunction of thoracic region: Secondary | ICD-10-CM | POA: Diagnosis not present

## 2022-08-06 DIAGNOSIS — M546 Pain in thoracic spine: Secondary | ICD-10-CM | POA: Diagnosis not present

## 2022-08-06 DIAGNOSIS — M531 Cervicobrachial syndrome: Secondary | ICD-10-CM | POA: Diagnosis not present

## 2022-08-06 DIAGNOSIS — M9901 Segmental and somatic dysfunction of cervical region: Secondary | ICD-10-CM | POA: Diagnosis not present

## 2022-08-10 ENCOUNTER — Encounter (INDEPENDENT_AMBULATORY_CARE_PROVIDER_SITE_OTHER): Payer: Medicare PPO | Admitting: Ophthalmology

## 2022-08-10 DIAGNOSIS — H353111 Nonexudative age-related macular degeneration, right eye, early dry stage: Secondary | ICD-10-CM | POA: Diagnosis not present

## 2022-08-10 DIAGNOSIS — H35371 Puckering of macula, right eye: Secondary | ICD-10-CM | POA: Diagnosis not present

## 2022-08-27 DIAGNOSIS — M9902 Segmental and somatic dysfunction of thoracic region: Secondary | ICD-10-CM | POA: Diagnosis not present

## 2022-08-27 DIAGNOSIS — M531 Cervicobrachial syndrome: Secondary | ICD-10-CM | POA: Diagnosis not present

## 2022-08-27 DIAGNOSIS — M546 Pain in thoracic spine: Secondary | ICD-10-CM | POA: Diagnosis not present

## 2022-08-27 DIAGNOSIS — M9901 Segmental and somatic dysfunction of cervical region: Secondary | ICD-10-CM | POA: Diagnosis not present

## 2022-08-27 DIAGNOSIS — M47812 Spondylosis without myelopathy or radiculopathy, cervical region: Secondary | ICD-10-CM | POA: Diagnosis not present

## 2022-09-15 DIAGNOSIS — M531 Cervicobrachial syndrome: Secondary | ICD-10-CM | POA: Diagnosis not present

## 2022-09-15 DIAGNOSIS — M9901 Segmental and somatic dysfunction of cervical region: Secondary | ICD-10-CM | POA: Diagnosis not present

## 2022-09-15 DIAGNOSIS — M47812 Spondylosis without myelopathy or radiculopathy, cervical region: Secondary | ICD-10-CM | POA: Diagnosis not present

## 2022-09-15 DIAGNOSIS — M9902 Segmental and somatic dysfunction of thoracic region: Secondary | ICD-10-CM | POA: Diagnosis not present

## 2022-09-15 DIAGNOSIS — M546 Pain in thoracic spine: Secondary | ICD-10-CM | POA: Diagnosis not present

## 2022-09-29 DIAGNOSIS — M546 Pain in thoracic spine: Secondary | ICD-10-CM | POA: Diagnosis not present

## 2022-09-29 DIAGNOSIS — M47812 Spondylosis without myelopathy or radiculopathy, cervical region: Secondary | ICD-10-CM | POA: Diagnosis not present

## 2022-09-29 DIAGNOSIS — M9901 Segmental and somatic dysfunction of cervical region: Secondary | ICD-10-CM | POA: Diagnosis not present

## 2022-09-29 DIAGNOSIS — M9902 Segmental and somatic dysfunction of thoracic region: Secondary | ICD-10-CM | POA: Diagnosis not present

## 2022-09-29 DIAGNOSIS — M531 Cervicobrachial syndrome: Secondary | ICD-10-CM | POA: Diagnosis not present

## 2022-10-29 DIAGNOSIS — M9902 Segmental and somatic dysfunction of thoracic region: Secondary | ICD-10-CM | POA: Diagnosis not present

## 2022-10-29 DIAGNOSIS — M546 Pain in thoracic spine: Secondary | ICD-10-CM | POA: Diagnosis not present

## 2022-10-29 DIAGNOSIS — M47812 Spondylosis without myelopathy or radiculopathy, cervical region: Secondary | ICD-10-CM | POA: Diagnosis not present

## 2022-10-29 DIAGNOSIS — M9901 Segmental and somatic dysfunction of cervical region: Secondary | ICD-10-CM | POA: Diagnosis not present

## 2022-10-29 DIAGNOSIS — M531 Cervicobrachial syndrome: Secondary | ICD-10-CM | POA: Diagnosis not present

## 2022-11-19 DIAGNOSIS — M546 Pain in thoracic spine: Secondary | ICD-10-CM | POA: Diagnosis not present

## 2022-11-19 DIAGNOSIS — M47812 Spondylosis without myelopathy or radiculopathy, cervical region: Secondary | ICD-10-CM | POA: Diagnosis not present

## 2022-11-19 DIAGNOSIS — M9902 Segmental and somatic dysfunction of thoracic region: Secondary | ICD-10-CM | POA: Diagnosis not present

## 2022-11-19 DIAGNOSIS — M531 Cervicobrachial syndrome: Secondary | ICD-10-CM | POA: Diagnosis not present

## 2022-11-19 DIAGNOSIS — M9901 Segmental and somatic dysfunction of cervical region: Secondary | ICD-10-CM | POA: Diagnosis not present

## 2022-12-02 DIAGNOSIS — M546 Pain in thoracic spine: Secondary | ICD-10-CM | POA: Diagnosis not present

## 2022-12-02 DIAGNOSIS — M47812 Spondylosis without myelopathy or radiculopathy, cervical region: Secondary | ICD-10-CM | POA: Diagnosis not present

## 2022-12-02 DIAGNOSIS — M9901 Segmental and somatic dysfunction of cervical region: Secondary | ICD-10-CM | POA: Diagnosis not present

## 2022-12-02 DIAGNOSIS — M9902 Segmental and somatic dysfunction of thoracic region: Secondary | ICD-10-CM | POA: Diagnosis not present

## 2022-12-02 DIAGNOSIS — M531 Cervicobrachial syndrome: Secondary | ICD-10-CM | POA: Diagnosis not present

## 2022-12-17 DIAGNOSIS — M47812 Spondylosis without myelopathy or radiculopathy, cervical region: Secondary | ICD-10-CM | POA: Diagnosis not present

## 2022-12-17 DIAGNOSIS — M9901 Segmental and somatic dysfunction of cervical region: Secondary | ICD-10-CM | POA: Diagnosis not present

## 2022-12-17 DIAGNOSIS — M9902 Segmental and somatic dysfunction of thoracic region: Secondary | ICD-10-CM | POA: Diagnosis not present

## 2022-12-17 DIAGNOSIS — M531 Cervicobrachial syndrome: Secondary | ICD-10-CM | POA: Diagnosis not present

## 2022-12-17 DIAGNOSIS — M546 Pain in thoracic spine: Secondary | ICD-10-CM | POA: Diagnosis not present

## 2022-12-31 DIAGNOSIS — M9901 Segmental and somatic dysfunction of cervical region: Secondary | ICD-10-CM | POA: Diagnosis not present

## 2022-12-31 DIAGNOSIS — M47812 Spondylosis without myelopathy or radiculopathy, cervical region: Secondary | ICD-10-CM | POA: Diagnosis not present

## 2022-12-31 DIAGNOSIS — M531 Cervicobrachial syndrome: Secondary | ICD-10-CM | POA: Diagnosis not present

## 2022-12-31 DIAGNOSIS — M546 Pain in thoracic spine: Secondary | ICD-10-CM | POA: Diagnosis not present

## 2022-12-31 DIAGNOSIS — M9902 Segmental and somatic dysfunction of thoracic region: Secondary | ICD-10-CM | POA: Diagnosis not present

## 2023-01-27 DIAGNOSIS — M9902 Segmental and somatic dysfunction of thoracic region: Secondary | ICD-10-CM | POA: Diagnosis not present

## 2023-01-27 DIAGNOSIS — M546 Pain in thoracic spine: Secondary | ICD-10-CM | POA: Diagnosis not present

## 2023-01-27 DIAGNOSIS — M531 Cervicobrachial syndrome: Secondary | ICD-10-CM | POA: Diagnosis not present

## 2023-01-27 DIAGNOSIS — M9901 Segmental and somatic dysfunction of cervical region: Secondary | ICD-10-CM | POA: Diagnosis not present

## 2023-01-27 DIAGNOSIS — M47812 Spondylosis without myelopathy or radiculopathy, cervical region: Secondary | ICD-10-CM | POA: Diagnosis not present

## 2023-02-02 DIAGNOSIS — E559 Vitamin D deficiency, unspecified: Secondary | ICD-10-CM | POA: Diagnosis not present

## 2023-02-02 DIAGNOSIS — I10 Essential (primary) hypertension: Secondary | ICD-10-CM | POA: Diagnosis not present

## 2023-02-02 DIAGNOSIS — E039 Hypothyroidism, unspecified: Secondary | ICD-10-CM | POA: Diagnosis not present

## 2023-02-02 DIAGNOSIS — R809 Proteinuria, unspecified: Secondary | ICD-10-CM | POA: Diagnosis not present

## 2023-02-08 DIAGNOSIS — H35373 Puckering of macula, bilateral: Secondary | ICD-10-CM | POA: Diagnosis not present

## 2023-02-08 DIAGNOSIS — H353111 Nonexudative age-related macular degeneration, right eye, early dry stage: Secondary | ICD-10-CM | POA: Diagnosis not present

## 2023-02-08 DIAGNOSIS — H21521 Goniosynechiae, right eye: Secondary | ICD-10-CM | POA: Diagnosis not present

## 2023-02-10 DIAGNOSIS — M546 Pain in thoracic spine: Secondary | ICD-10-CM | POA: Diagnosis not present

## 2023-02-10 DIAGNOSIS — M9901 Segmental and somatic dysfunction of cervical region: Secondary | ICD-10-CM | POA: Diagnosis not present

## 2023-02-10 DIAGNOSIS — M47812 Spondylosis without myelopathy or radiculopathy, cervical region: Secondary | ICD-10-CM | POA: Diagnosis not present

## 2023-02-10 DIAGNOSIS — M531 Cervicobrachial syndrome: Secondary | ICD-10-CM | POA: Diagnosis not present

## 2023-02-10 DIAGNOSIS — M9902 Segmental and somatic dysfunction of thoracic region: Secondary | ICD-10-CM | POA: Diagnosis not present

## 2023-02-12 DIAGNOSIS — E559 Vitamin D deficiency, unspecified: Secondary | ICD-10-CM | POA: Diagnosis not present

## 2023-02-12 DIAGNOSIS — N182 Chronic kidney disease, stage 2 (mild): Secondary | ICD-10-CM | POA: Diagnosis not present

## 2023-02-12 DIAGNOSIS — N1831 Chronic kidney disease, stage 3a: Secondary | ICD-10-CM | POA: Diagnosis not present

## 2023-02-12 DIAGNOSIS — E782 Mixed hyperlipidemia: Secondary | ICD-10-CM | POA: Diagnosis not present

## 2023-02-12 DIAGNOSIS — E039 Hypothyroidism, unspecified: Secondary | ICD-10-CM | POA: Diagnosis not present

## 2023-02-12 DIAGNOSIS — I129 Hypertensive chronic kidney disease with stage 1 through stage 4 chronic kidney disease, or unspecified chronic kidney disease: Secondary | ICD-10-CM | POA: Diagnosis not present

## 2023-02-12 DIAGNOSIS — I10 Essential (primary) hypertension: Secondary | ICD-10-CM | POA: Diagnosis not present

## 2023-02-12 DIAGNOSIS — G4700 Insomnia, unspecified: Secondary | ICD-10-CM | POA: Diagnosis not present

## 2023-02-12 DIAGNOSIS — L209 Atopic dermatitis, unspecified: Secondary | ICD-10-CM | POA: Diagnosis not present

## 2023-02-23 DIAGNOSIS — M9902 Segmental and somatic dysfunction of thoracic region: Secondary | ICD-10-CM | POA: Diagnosis not present

## 2023-02-23 DIAGNOSIS — M47812 Spondylosis without myelopathy or radiculopathy, cervical region: Secondary | ICD-10-CM | POA: Diagnosis not present

## 2023-02-23 DIAGNOSIS — M9901 Segmental and somatic dysfunction of cervical region: Secondary | ICD-10-CM | POA: Diagnosis not present

## 2023-02-23 DIAGNOSIS — M531 Cervicobrachial syndrome: Secondary | ICD-10-CM | POA: Diagnosis not present

## 2023-02-23 DIAGNOSIS — M546 Pain in thoracic spine: Secondary | ICD-10-CM | POA: Diagnosis not present

## 2023-03-01 IMAGING — DX DG CHEST 2V
2 series · 2 of 2 positions shown · non-contrast
Comparison: 07/14/2017

CLINICAL DATA: Cough

EXAM:
CHEST - 2 VIEW

[chest pa]
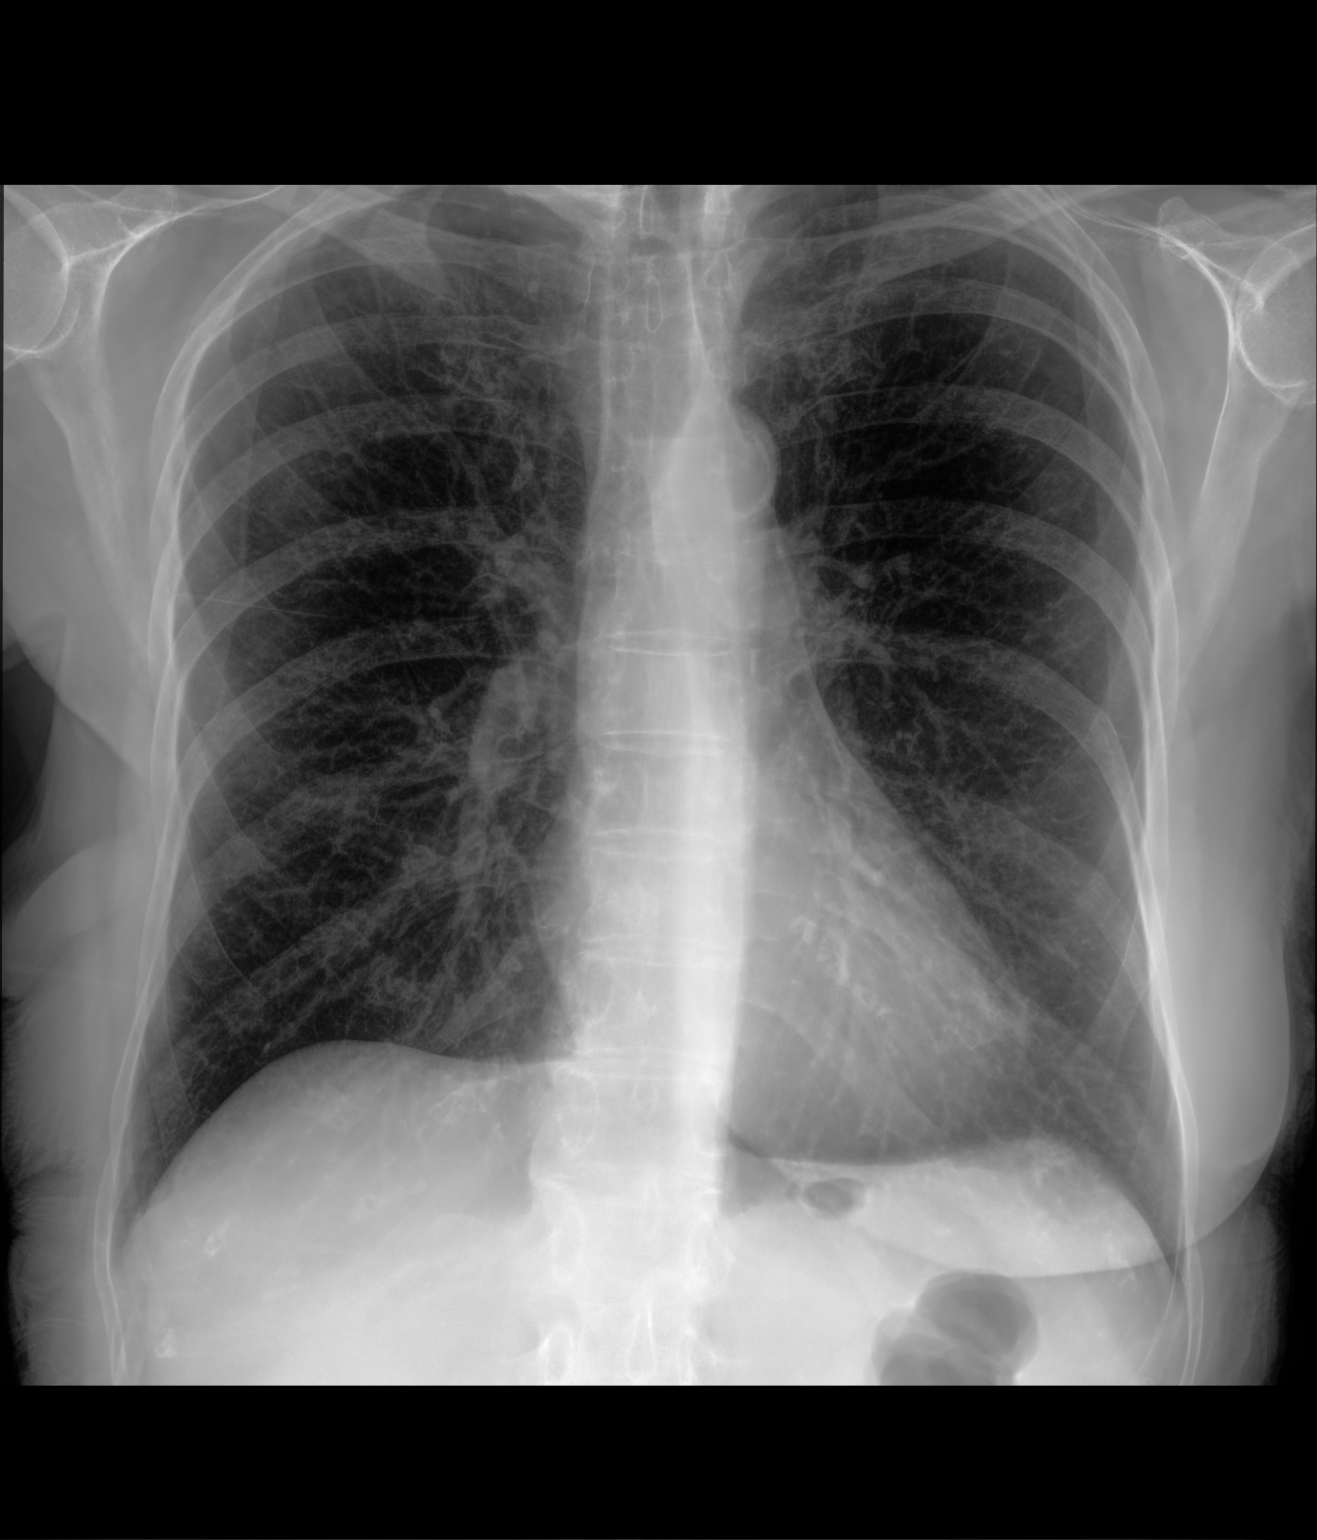

[chest lat]
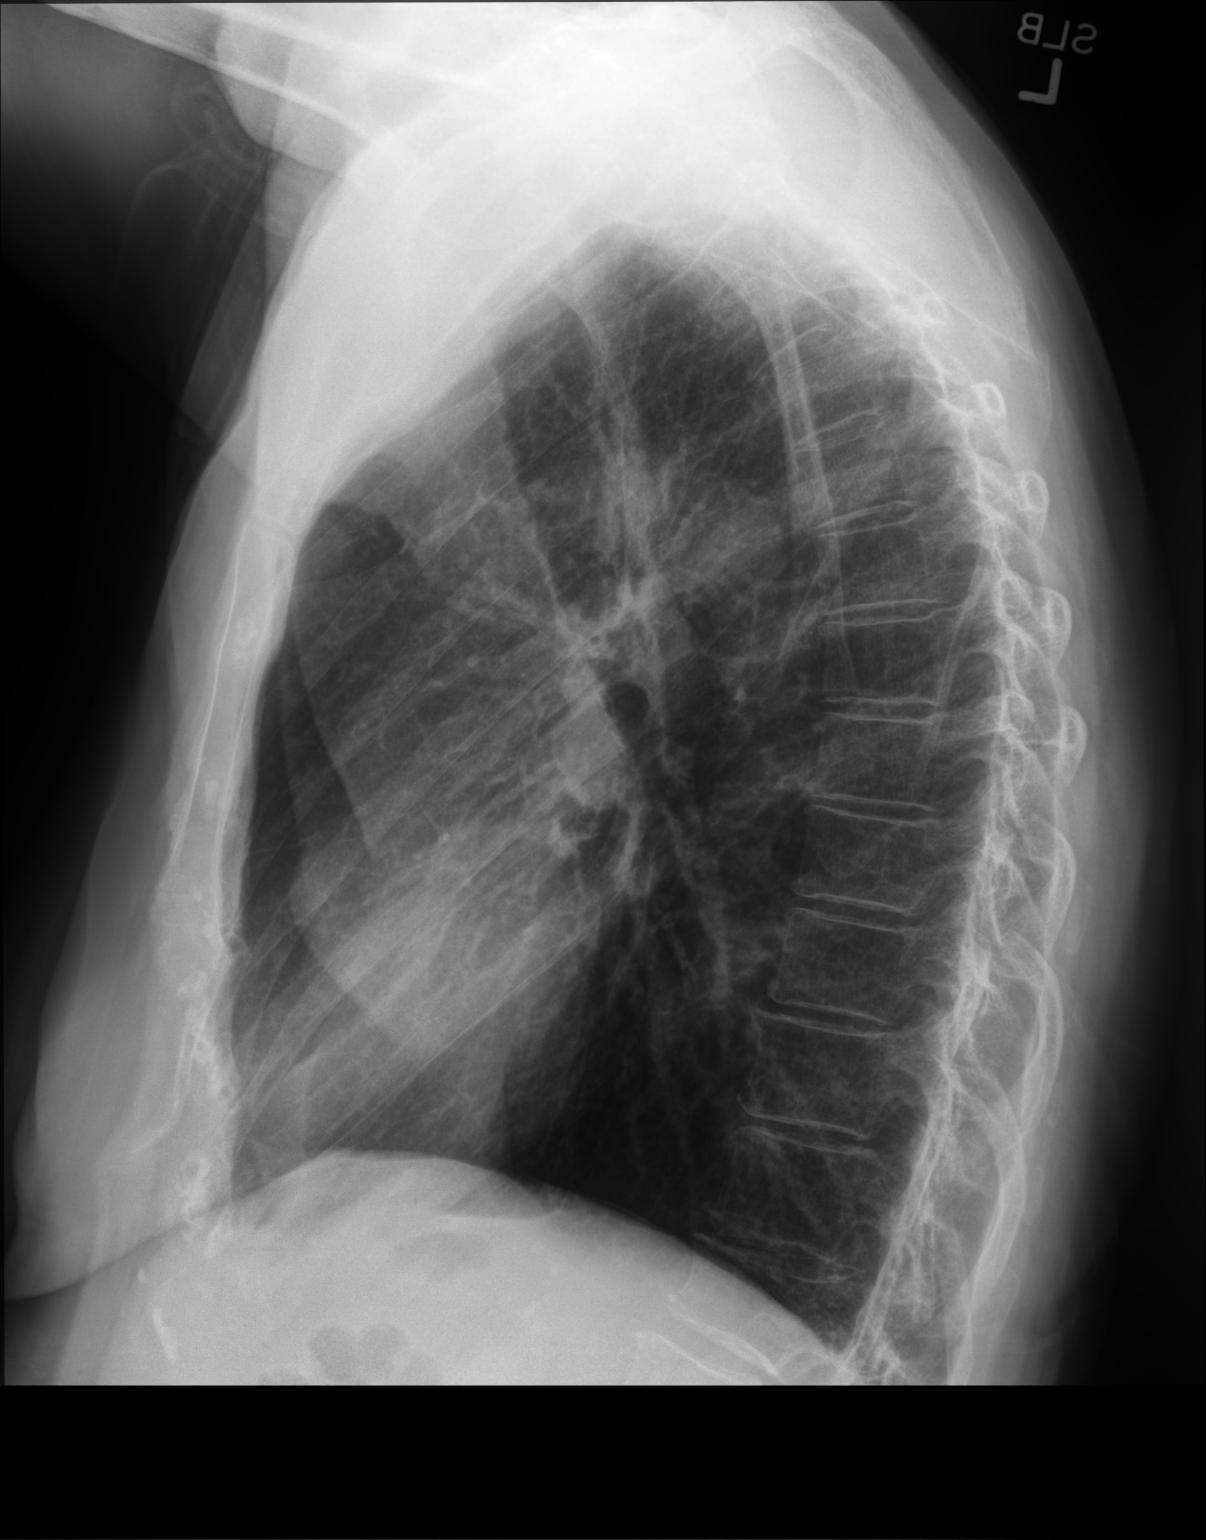

[2 of 2 positions shown; findings below may reference images not displayed]

FINDINGS: The heart size and mediastinal contours are within normal limits.
Atherosclerotic calcification of the aortic knob. No focal airspace
consolidation, pleural effusion, or pneumothorax. The visualized
skeletal structures are unremarkable.
IMPRESSION: No active cardiopulmonary disease.

## 2023-03-09 DIAGNOSIS — M531 Cervicobrachial syndrome: Secondary | ICD-10-CM | POA: Diagnosis not present

## 2023-03-09 DIAGNOSIS — M9901 Segmental and somatic dysfunction of cervical region: Secondary | ICD-10-CM | POA: Diagnosis not present

## 2023-03-09 DIAGNOSIS — M9902 Segmental and somatic dysfunction of thoracic region: Secondary | ICD-10-CM | POA: Diagnosis not present

## 2023-03-09 DIAGNOSIS — M546 Pain in thoracic spine: Secondary | ICD-10-CM | POA: Diagnosis not present

## 2023-03-09 DIAGNOSIS — M47812 Spondylosis without myelopathy or radiculopathy, cervical region: Secondary | ICD-10-CM | POA: Diagnosis not present

## 2023-03-22 DIAGNOSIS — Z9889 Other specified postprocedural states: Secondary | ICD-10-CM | POA: Diagnosis not present

## 2023-03-22 DIAGNOSIS — H538 Other visual disturbances: Secondary | ICD-10-CM | POA: Diagnosis not present

## 2023-03-22 DIAGNOSIS — Z961 Presence of intraocular lens: Secondary | ICD-10-CM | POA: Diagnosis not present

## 2023-03-23 DIAGNOSIS — M47812 Spondylosis without myelopathy or radiculopathy, cervical region: Secondary | ICD-10-CM | POA: Diagnosis not present

## 2023-03-23 DIAGNOSIS — M9901 Segmental and somatic dysfunction of cervical region: Secondary | ICD-10-CM | POA: Diagnosis not present

## 2023-03-23 DIAGNOSIS — M531 Cervicobrachial syndrome: Secondary | ICD-10-CM | POA: Diagnosis not present

## 2023-03-23 DIAGNOSIS — M546 Pain in thoracic spine: Secondary | ICD-10-CM | POA: Diagnosis not present

## 2023-03-23 DIAGNOSIS — M9902 Segmental and somatic dysfunction of thoracic region: Secondary | ICD-10-CM | POA: Diagnosis not present

## 2023-04-08 DIAGNOSIS — M9901 Segmental and somatic dysfunction of cervical region: Secondary | ICD-10-CM | POA: Diagnosis not present

## 2023-04-08 DIAGNOSIS — M546 Pain in thoracic spine: Secondary | ICD-10-CM | POA: Diagnosis not present

## 2023-04-08 DIAGNOSIS — M9902 Segmental and somatic dysfunction of thoracic region: Secondary | ICD-10-CM | POA: Diagnosis not present

## 2023-04-08 DIAGNOSIS — M47812 Spondylosis without myelopathy or radiculopathy, cervical region: Secondary | ICD-10-CM | POA: Diagnosis not present

## 2023-04-08 DIAGNOSIS — M531 Cervicobrachial syndrome: Secondary | ICD-10-CM | POA: Diagnosis not present

## 2023-04-22 DIAGNOSIS — M9901 Segmental and somatic dysfunction of cervical region: Secondary | ICD-10-CM | POA: Diagnosis not present

## 2023-04-22 DIAGNOSIS — M9902 Segmental and somatic dysfunction of thoracic region: Secondary | ICD-10-CM | POA: Diagnosis not present

## 2023-04-22 DIAGNOSIS — M531 Cervicobrachial syndrome: Secondary | ICD-10-CM | POA: Diagnosis not present

## 2023-04-22 DIAGNOSIS — M47812 Spondylosis without myelopathy or radiculopathy, cervical region: Secondary | ICD-10-CM | POA: Diagnosis not present

## 2023-04-22 DIAGNOSIS — M546 Pain in thoracic spine: Secondary | ICD-10-CM | POA: Diagnosis not present

## 2023-05-10 DIAGNOSIS — M531 Cervicobrachial syndrome: Secondary | ICD-10-CM | POA: Diagnosis not present

## 2023-05-10 DIAGNOSIS — M47812 Spondylosis without myelopathy or radiculopathy, cervical region: Secondary | ICD-10-CM | POA: Diagnosis not present

## 2023-05-10 DIAGNOSIS — M9901 Segmental and somatic dysfunction of cervical region: Secondary | ICD-10-CM | POA: Diagnosis not present

## 2023-05-10 DIAGNOSIS — M9902 Segmental and somatic dysfunction of thoracic region: Secondary | ICD-10-CM | POA: Diagnosis not present

## 2023-05-10 DIAGNOSIS — M546 Pain in thoracic spine: Secondary | ICD-10-CM | POA: Diagnosis not present

## 2023-06-02 DIAGNOSIS — M9901 Segmental and somatic dysfunction of cervical region: Secondary | ICD-10-CM | POA: Diagnosis not present

## 2023-06-02 DIAGNOSIS — M546 Pain in thoracic spine: Secondary | ICD-10-CM | POA: Diagnosis not present

## 2023-06-02 DIAGNOSIS — M9902 Segmental and somatic dysfunction of thoracic region: Secondary | ICD-10-CM | POA: Diagnosis not present

## 2023-06-02 DIAGNOSIS — M47812 Spondylosis without myelopathy or radiculopathy, cervical region: Secondary | ICD-10-CM | POA: Diagnosis not present

## 2023-06-02 DIAGNOSIS — M531 Cervicobrachial syndrome: Secondary | ICD-10-CM | POA: Diagnosis not present

## 2023-06-21 DIAGNOSIS — M9902 Segmental and somatic dysfunction of thoracic region: Secondary | ICD-10-CM | POA: Diagnosis not present

## 2023-06-21 DIAGNOSIS — M546 Pain in thoracic spine: Secondary | ICD-10-CM | POA: Diagnosis not present

## 2023-06-21 DIAGNOSIS — M9901 Segmental and somatic dysfunction of cervical region: Secondary | ICD-10-CM | POA: Diagnosis not present

## 2023-06-21 DIAGNOSIS — M47812 Spondylosis without myelopathy or radiculopathy, cervical region: Secondary | ICD-10-CM | POA: Diagnosis not present

## 2023-06-21 DIAGNOSIS — M531 Cervicobrachial syndrome: Secondary | ICD-10-CM | POA: Diagnosis not present

## 2023-07-08 DIAGNOSIS — M47812 Spondylosis without myelopathy or radiculopathy, cervical region: Secondary | ICD-10-CM | POA: Diagnosis not present

## 2023-07-08 DIAGNOSIS — M546 Pain in thoracic spine: Secondary | ICD-10-CM | POA: Diagnosis not present

## 2023-07-08 DIAGNOSIS — M9901 Segmental and somatic dysfunction of cervical region: Secondary | ICD-10-CM | POA: Diagnosis not present

## 2023-07-08 DIAGNOSIS — M531 Cervicobrachial syndrome: Secondary | ICD-10-CM | POA: Diagnosis not present

## 2023-07-08 DIAGNOSIS — M9902 Segmental and somatic dysfunction of thoracic region: Secondary | ICD-10-CM | POA: Diagnosis not present

## 2023-07-22 DIAGNOSIS — M9901 Segmental and somatic dysfunction of cervical region: Secondary | ICD-10-CM | POA: Diagnosis not present

## 2023-07-22 DIAGNOSIS — M47812 Spondylosis without myelopathy or radiculopathy, cervical region: Secondary | ICD-10-CM | POA: Diagnosis not present

## 2023-07-22 DIAGNOSIS — M9902 Segmental and somatic dysfunction of thoracic region: Secondary | ICD-10-CM | POA: Diagnosis not present

## 2023-07-22 DIAGNOSIS — M546 Pain in thoracic spine: Secondary | ICD-10-CM | POA: Diagnosis not present

## 2023-07-22 DIAGNOSIS — M531 Cervicobrachial syndrome: Secondary | ICD-10-CM | POA: Diagnosis not present

## 2023-08-05 DIAGNOSIS — M531 Cervicobrachial syndrome: Secondary | ICD-10-CM | POA: Diagnosis not present

## 2023-08-05 DIAGNOSIS — M9902 Segmental and somatic dysfunction of thoracic region: Secondary | ICD-10-CM | POA: Diagnosis not present

## 2023-08-05 DIAGNOSIS — M9901 Segmental and somatic dysfunction of cervical region: Secondary | ICD-10-CM | POA: Diagnosis not present

## 2023-08-05 DIAGNOSIS — M47812 Spondylosis without myelopathy or radiculopathy, cervical region: Secondary | ICD-10-CM | POA: Diagnosis not present

## 2023-08-05 DIAGNOSIS — M546 Pain in thoracic spine: Secondary | ICD-10-CM | POA: Diagnosis not present

## 2023-08-17 DIAGNOSIS — E559 Vitamin D deficiency, unspecified: Secondary | ICD-10-CM | POA: Diagnosis not present

## 2023-08-17 DIAGNOSIS — E039 Hypothyroidism, unspecified: Secondary | ICD-10-CM | POA: Diagnosis not present

## 2023-08-17 DIAGNOSIS — R809 Proteinuria, unspecified: Secondary | ICD-10-CM | POA: Diagnosis not present

## 2023-08-17 DIAGNOSIS — I10 Essential (primary) hypertension: Secondary | ICD-10-CM | POA: Diagnosis not present

## 2023-08-23 DIAGNOSIS — E782 Mixed hyperlipidemia: Secondary | ICD-10-CM | POA: Diagnosis not present

## 2023-08-23 DIAGNOSIS — E039 Hypothyroidism, unspecified: Secondary | ICD-10-CM | POA: Diagnosis not present

## 2023-08-23 DIAGNOSIS — E559 Vitamin D deficiency, unspecified: Secondary | ICD-10-CM | POA: Diagnosis not present

## 2023-08-23 DIAGNOSIS — R809 Proteinuria, unspecified: Secondary | ICD-10-CM | POA: Diagnosis not present

## 2023-08-23 DIAGNOSIS — I129 Hypertensive chronic kidney disease with stage 1 through stage 4 chronic kidney disease, or unspecified chronic kidney disease: Secondary | ICD-10-CM | POA: Diagnosis not present

## 2023-08-23 DIAGNOSIS — F5109 Other insomnia not due to a substance or known physiological condition: Secondary | ICD-10-CM | POA: Diagnosis not present

## 2023-08-23 DIAGNOSIS — Z0001 Encounter for general adult medical examination with abnormal findings: Secondary | ICD-10-CM | POA: Diagnosis not present

## 2023-08-23 DIAGNOSIS — I10 Essential (primary) hypertension: Secondary | ICD-10-CM | POA: Diagnosis not present

## 2023-08-23 DIAGNOSIS — N1831 Chronic kidney disease, stage 3a: Secondary | ICD-10-CM | POA: Diagnosis not present

## 2023-08-24 DIAGNOSIS — M9901 Segmental and somatic dysfunction of cervical region: Secondary | ICD-10-CM | POA: Diagnosis not present

## 2023-08-24 DIAGNOSIS — M47812 Spondylosis without myelopathy or radiculopathy, cervical region: Secondary | ICD-10-CM | POA: Diagnosis not present

## 2023-08-24 DIAGNOSIS — M9902 Segmental and somatic dysfunction of thoracic region: Secondary | ICD-10-CM | POA: Diagnosis not present

## 2023-08-24 DIAGNOSIS — M546 Pain in thoracic spine: Secondary | ICD-10-CM | POA: Diagnosis not present

## 2023-08-24 DIAGNOSIS — M531 Cervicobrachial syndrome: Secondary | ICD-10-CM | POA: Diagnosis not present

## 2024-01-24 DIAGNOSIS — H353111 Nonexudative age-related macular degeneration, right eye, early dry stage: Secondary | ICD-10-CM | POA: Diagnosis not present

## 2024-01-24 DIAGNOSIS — H21521 Goniosynechiae, right eye: Secondary | ICD-10-CM | POA: Diagnosis not present

## 2024-01-24 DIAGNOSIS — H35372 Puckering of macula, left eye: Secondary | ICD-10-CM | POA: Diagnosis not present

## 2024-01-24 DIAGNOSIS — H26493 Other secondary cataract, bilateral: Secondary | ICD-10-CM | POA: Diagnosis not present

## 2024-07-27 ENCOUNTER — Encounter: Payer: Self-pay | Admitting: *Deleted

## 2024-07-27 NOTE — Progress Notes (Signed)
 Anita Weiss                                          MRN: [id number]   07/27/2024   The [facility] Quality Team Specialist reviewed this patient medical record for the purposes of chart review for care gap closure. The following were reviewed: chart review for care gap closure-controlling blood pressure.    [facility] Quality Team
